# Patient Record
Sex: Male | Born: 1965 | Race: White | Hispanic: No | Marital: Married | Smoking: Current every day smoker
Health system: Southern US, Community
[De-identification: ages and names within clinical notes are randomized; demographics above are authoritative.]

---

## 2003-03-07 ENCOUNTER — Emergency Department (HOSPITAL_COMMUNITY): Admission: EM | Admit: 2003-03-07 | Discharge: 2003-03-07 | Payer: Self-pay | Admitting: Emergency Medicine

## 2015-09-08 ENCOUNTER — Encounter: Payer: Self-pay | Admitting: Vascular Surgery

## 2015-09-09 ENCOUNTER — Encounter: Payer: Self-pay | Admitting: Vascular Surgery

## 2015-09-16 ENCOUNTER — Encounter: Payer: Self-pay | Admitting: Vascular Surgery

## 2015-09-17 ENCOUNTER — Ambulatory Visit (INDEPENDENT_AMBULATORY_CARE_PROVIDER_SITE_OTHER): Payer: BLUE CROSS/BLUE SHIELD | Admitting: Vascular Surgery

## 2015-09-17 ENCOUNTER — Encounter: Payer: Self-pay | Admitting: Vascular Surgery

## 2015-09-17 VITALS — BP 130/88 | HR 82 | Temp 98.0°F | Resp 14 | Ht 67.0 in | Wt 135.0 lb

## 2015-09-17 DIAGNOSIS — K551 Chronic vascular disorders of intestine: Secondary | ICD-10-CM | POA: Diagnosis not present

## 2015-09-17 NOTE — Progress Notes (Signed)
[facility]ist [facility] [place]  Patient name: Alexander Weiss MRN: [id number] DOB: Oct 27, 1965 Sex: male  REASON FOR CONSULT: inferior mesenteric artery occlusion. Referred by Dr. Susann Givens  HPI: Alexander Weiss is a 50 y.o. male, who developed the sudden onset of lower abdominal pain and back pain in late May. He was seen in an emergency department and a CT scan was obtained to rule out a kidney stone. He was noted to have some plaque in the infrarenal aorta and the origin of the inferior mesenteric artery appeared to be occluded. There was no associated colonic wall thickening to suggest bowel ischemia. He did not have a kidney stone.  His symptoms lasted for about 10 days and then have completely resolved. Currently he denies any abdominal pain or back pain. He denies any history of postprandial abdominal pain or weight loss.  His risk factors for peripheral vascular disease include a family history of premature cardiovascular disease and a history of tobacco use. He denies any history of diabetes, hypertension, or hypercholesterolemia.  No past medical history on file. He denies any history of diabetes, hypertension, hypercholesterolemia, history of previous myocardial infarction, history of congestive heart failure, or history of arrhythmias.  No family history on file. his father died from a heart attack at age 50.  SOCIAL HISTORY: He has smoked 1 pack per day of cigarettes for over 20 years. Social History   Social History  . Marital Status: Married    Spouse Name: N/A  . Number of Children: N/A  . Years of Education: N/A   Occupational History  . Not on file.   Social History Main Topics  . Smoking status: Current Every Day Smoker -- 1.00 packs/day    Types: Cigarettes  . Smokeless tobacco: Not on file  . Alcohol Use: Not on file  . Drug Use: Not on file  . Sexual Activity: Not on file   Other Topics Concern  . Not on file   Social History Narrative     No Known Allergies  Current Outpatient Prescriptions  Medication Sig Dispense Refill  . aspirin-acetaminophen-caffeine (EXCEDRIN MIGRAINE) 250-250-65 MG tablet Take by mouth every 6 (six) hours as needed for headache.     No current facility-administered medications for this visit.    REVIEW OF SYSTEMS:   denotes positive finding,  denotes negative finding Cardiac  Comments:  Chest pain or chest pressure:    Shortness of breath upon exertion: X   Short of breath when lying flat:    Irregular heart rhythm:        Vascular    Pain in calf, thigh, or hip brought on by ambulation:    Pain in feet at night that wakes you up from your sleep:     Blood clot in your veins:    Leg swelling:         Pulmonary    Oxygen at home:    Productive cough:  X   Wheezing:         Neurologic    Sudden weakness in arms or legs:     Sudden numbness in arms or legs:     Sudden onset of difficulty speaking or slurred speech:    Temporary loss of vision in one eye:     Problems with dizziness:         Gastrointestinal    Blood in stool:     Vomited blood:         Genitourinary  Burning when urinating:     Blood in urine:        Psychiatric    Major depression:         Hematologic    Bleeding problems:    Problems with blood clotting too easily:        Skin    Rashes or ulcers:        Constitutional    Fever or chills:      PHYSICAL EXAM: Filed Vitals:   09/17/15 1607  BP: 130/88  Pulse: 82  Temp: 98 F (36.7 C)  TempSrc: Oral  Resp: 14  Height: 5\' 7"  (1.702 m)  Weight: 135 lb (61.236 kg)  SpO2: 99%    GENERAL: The patient is a well-nourished male, in no acute distress. The vital signs are documented above. CARDIAC: There is a regular rate and rhythm.  VASCULAR: I do not detect carotid bruits. He has palpable femoral, dorsalis pedis, and posterior tibial pulses bilaterally. He has no significant lower extremity swelling. PULMONARY: There is good air  exchange bilaterally without wheezing or rales. ABDOMEN: Soft and non-tender with normal pitched bowel sounds. I cannot palpate an abdominal aortic aneurysm. I do not appreciate an abdominal bruit. MUSCULOSKELETAL: There are no major deformities or cyanosis. NEUROLOGIC: No focal weakness or paresthesias are detected. SKIN: There are no ulcers or rashes noted. PSYCHIATRIC: The patient has a normal affect.  DATA:   CT SCAN ABDOMEN AND PELVIS: I have reviewed the CT scan of the abdomen and pelvis that was performed on 08/29/2015. There is moderate atherosclerotic plaque in the infrarenal aorta. The origin of the inferior mesenteric artery is noted to be occluded. The celiac and superior mesenteric arteries are patent.  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 I think this may very well have explained his symptoms. Fortunately, his superior mesenteric artery and celiac axis are widely patent and he has no evidence of mesenteric ischemia. He does not have any aneurysmal disease. Having laminated thrombus in the aorta which is my interpretation of the finding is very unusual. I have encouraged him to begin taking an aspirin a day (325 mg). We have also had a long discussion about the importance of tobacco cessation. I have recommended a follow up CT scan in 1 year. If the clot has resolved or is stable then I do not think he will need routine follow up studies. However if this has changed at all and he may need continued follow up. I'll be happy to see him sooner if any new vascular issues arise.  Waverly Ferrariickson, Christopher [facility] [place] [phone]

## 2015-09-22 ENCOUNTER — Telehealth: Payer: Self-pay | Admitting: Cardiovascular Disease

## 2015-09-22 NOTE — Telephone Encounter (Signed)
[facility] for appointment on 09/24/15 with Dr Allyson SabalBerry.  [facility] Dr Hazle CocaBerry's schedule on 09/24/15. lp

## 2015-09-24 ENCOUNTER — Encounter: Payer: Self-pay | Admitting: Cardiovascular Disease

## 2015-09-24 ENCOUNTER — Ambulatory Visit
Admission: RE | Admit: 2015-09-24 | Discharge: 2015-09-24 | Disposition: A | Discharge status: Home / Self Care | Payer: BLUE CROSS/BLUE SHIELD | Source: Ambulatory Visit | Attending: Cardiovascular Disease | Admitting: Cardiovascular Disease

## 2015-09-24 ENCOUNTER — Encounter (INDEPENDENT_AMBULATORY_CARE_PROVIDER_SITE_OTHER): Payer: BLUE CROSS/BLUE SHIELD

## 2015-09-24 ENCOUNTER — Encounter: Payer: Self-pay | Admitting: Diagnostic Radiology

## 2015-09-24 ENCOUNTER — Other Ambulatory Visit: Payer: Self-pay | Admitting: *Deleted

## 2015-09-24 ENCOUNTER — Ambulatory Visit (INDEPENDENT_AMBULATORY_CARE_PROVIDER_SITE_OTHER): Payer: BLUE CROSS/BLUE SHIELD | Admitting: Cardiovascular Disease

## 2015-09-24 VITALS — BP 136/80 | HR 58 | Ht 67.0 in | Wt 137.0 lb

## 2015-09-24 DIAGNOSIS — K55059 Acute (reversible) ischemia of intestine, part and extent unspecified: Secondary | ICD-10-CM

## 2015-09-24 DIAGNOSIS — I8290 Acute embolism and thrombosis of unspecified vein: Secondary | ICD-10-CM

## 2015-09-24 DIAGNOSIS — Z01818 Encounter for other preprocedural examination: Secondary | ICD-10-CM

## 2015-09-24 DIAGNOSIS — Z79899 Other long term (current) drug therapy: Secondary | ICD-10-CM

## 2015-09-24 DIAGNOSIS — K55069 Acute infarction of intestine, part and extent unspecified: Secondary | ICD-10-CM

## 2015-09-24 NOTE — Progress Notes (Signed)
09/24/2015 Alexander Weiss   May 26, 1965  [id number]  Primary Physician Hadley PenOBBINS,ROBERT A, MD Primary Cardiologist: Runell GessJonathan J Berry MD Roseanne RenoFACP, FACC, FAHA, FSCAI  HPI:  Alexander Weiss is a very pleasant 50 year old married Caucasian male father of 2 who works for [employer]. He is referred by Dr. Sherral Hammersobbins for peripheral vascular evaluation because of presumed acute mesenteric ischemia. His risk factors include 30-pack-years of tobacco abuse smoking one pack per day. His father died of a myocardial infarction at age 48. history of the acute onset of lower abdominal and back pain 3 weeks ago some nausea and vomiting. His last for approximately a week. He went to urgent care Center where an abdominal CT angiographic was ordered revealing an occluded inferior mesenteric artery with some laminated thrombus and this distal abdominal aorta. He saw Dr. Edilia Boickson and vein and vascular specialists on 09/17/15 who agreed that his symptoms were consistent with acute mesenteric ischemia.   Current Outpatient Prescriptions  Medication Sig Dispense Refill  . aspirin-acetaminophen-caffeine (EXCEDRIN MIGRAINE) 250-250-65 MG tablet Take by mouth every 6 (six) hours as needed for headache.    . nebivolol (BYSTOLIC) 5 MG tablet Take 5 mg by mouth daily.     No current facility-administered medications for this visit.    No Known Allergies  Social History   Social History  . Marital Status: Married    Spouse Name: N/A  . Number of Children: N/A  . Years of Education: N/A   Occupational History  . Not on file.   Social History Main Topics  . Smoking status: Current Every Day Smoker -- 1.00 packs/day    Types: Cigarettes  . Smokeless tobacco: Former NeurosurgeonUser  . Alcohol Use: Not on file  . Drug Use: Not on file  . Sexual Activity: Not on file   Other Topics Concern  . Not on file   Social History Narrative     Review of Systems: General: negative for chills, fever, night sweats or weight changes.    Cardiovascular: negative for chest pain, dyspnea on exertion, edema, orthopnea, palpitations, paroxysmal nocturnal dyspnea or shortness of breath Dermatological: negative for rash Respiratory: negative for cough or wheezing Urologic: negative for hematuria Abdominal: negative for nausea, vomiting, diarrhea, bright red blood per rectum, melena, or hematemesis Neurologic: negative for visual changes, syncope, or dizziness All other systems reviewed and are otherwise negative except as noted above.    Blood pressure 136/80, pulse 58, height 5\' 7"  (1.702 m), weight 137 lb (62.143 kg).  General appearance: alert and no distress Neck: no adenopathy, no carotid bruit, no JVD, supple, symmetrical, trachea midline and thyroid not enlarged, symmetric, no tenderness/mass/nodules Lungs: clear to auscultation bilaterally Heart: regular rate and rhythm, S1, S2 normal, no murmur, click, rub or gallop Extremities: extremities normal, atraumatic, no cyanosis or edema  EKG sinus bradycardia at 58  with ST or T-wave changes. I personally reviewed this EKG  ASSESSMENT AND PLAN:   Acute mesenteric ischemia [facility](HCC) Alexander Weiss was referred by Dr. Sherral Hammersobbins for evaluation of acute onset back and abdominal pain approximately 3 weeks ago lasting for a week. He had a CT angiogram that showed obstruction of his and her mesenteric artery with some laminated thrombus in his distal abdominal aorta. There is no CT evidence of mesenteric ischemia however. He denies palpitations. He's never had a stroke . I am concerned that this may have been thromboembolic. I agree that starting aspirin would be appropriate. I'm going to get a 30 day event  monitor and arrange for him to have a TEE echo in hospital.      Runell GessJonathan J. Berry MD [facility] CenterFACP,FACC,FAHA, [facility]SCAI 09/24/2015 1:48 PM

## 2015-09-24 NOTE — Assessment & Plan Note (Signed)
Mr. Alexander Weiss was referred by Dr. Sherral Hammersobbins for evaluation of acute onset back and abdominal pain approximately 3 weeks ago lasting for a week. He had a CT angiogram that showed obstruction of his and her mesenteric artery with some laminated thrombus in his distal abdominal aorta. There is no CT evidence of mesenteric ischemia however. He denies palpitations. He's never had a stroke . I am concerned that this may have been thromboembolic. I agree that starting aspirin would be appropriate. I'm going to get a 30 day event monitor and arrange for him to have a TEE echo in hospital.

## 2015-09-24 NOTE — Patient Instructions (Addendum)
Medication Instructions:  Your physician recommends that you continue on your current medications as directed. Please refer to the Current Medication list given to you today.   LABWORK AND CHEST X RAY: 1-- Your physician recommends that you return for lab work WITHIN 14 DAYS OF THE PROCEDURE.  2. Chest Xray-the chest [facility].      Testing/Procedures: Your physician has recommended that you wear an event monitor. Event monitors are medical devices that record the heart's electrical activity. Doctors most often us these monitors to diagnose arrhythmias. Arrhythmias are problems with the speed or rhythm of the heartbeat. The monitor is a small, portable device. You can wear one while you do your normal daily activities. This is usually used to diagnose what is causing palpitations/syncope (passing out).  30 DAY MONITOR.   Your physician has requested that you have a TEE. During a TEE, sound waves are used to create images of your heart. It provides your doctor with information about the size and shape of your heart and how well your heart's chambers and valves are working. In this test, a transducer is attached to the end of a flexible tube that's guided down your throat and into your esophagus (the tube leading from you mouth to your stomach) to get a more detailed image of your heart. You are not awake for the procedure. Please see the instruction sheet given to you today. For further information please visit [email or web address].   Follow-Up: Your physician recommends that you schedule a follow-up appointment AFTER THE PROCEDURES ARE COMPLETE.   Any Other Special Instructions Will Be Listed Below (If Applicable).     If you need a refill on your cardiac medications before your next appointment, please call your pharmacy.

## 2015-09-28 ENCOUNTER — Encounter (HOSPITAL_COMMUNITY): Admission: RE | Payer: Self-pay | Source: Ambulatory Visit

## 2015-09-28 SURGERY — ECHOCARDIOGRAM, TRANSESOPHAGEAL
Anesthesia: Moderate Sedation

## 2015-09-29 ENCOUNTER — Encounter: Payer: Self-pay | Admitting: Cardiovascular Disease

## 2015-09-29 ENCOUNTER — Ambulatory Visit (HOSPITAL_COMMUNITY): Admission: RE | Admit: 2015-09-29 | Payer: BLUE CROSS/BLUE SHIELD | Source: Ambulatory Visit | Admitting: Cardiology

## 2015-11-18 ENCOUNTER — Encounter: Payer: Self-pay | Admitting: *Deleted

## 2015-12-10 ENCOUNTER — Ambulatory Visit: Payer: Self-pay | Admitting: Sports Medicine

## 2016-01-26 DIAGNOSIS — Z720 Tobacco use: Secondary | ICD-10-CM | POA: Insufficient documentation

## 2016-01-26 DIAGNOSIS — K4020 Bilateral inguinal hernia, without obstruction or gangrene, not specified as recurrent: Secondary | ICD-10-CM | POA: Insufficient documentation

## 2016-03-01 DIAGNOSIS — Z09 Encounter for follow-up examination after completed treatment for conditions other than malignant neoplasm: Secondary | ICD-10-CM | POA: Insufficient documentation

## 2016-09-15 ENCOUNTER — Encounter: Payer: Self-pay | Admitting: Vascular Surgery

## 2016-09-18 ENCOUNTER — Other Ambulatory Visit: Payer: BLUE CROSS/BLUE SHIELD

## 2016-09-27 ENCOUNTER — Ambulatory Visit: Payer: BLUE CROSS/BLUE SHIELD | Admitting: Vascular Surgery

## 2017-03-29 DIAGNOSIS — A040 Enteropathogenic Escherichia coli infection: Secondary | ICD-10-CM | POA: Insufficient documentation

## 2017-05-16 ENCOUNTER — Encounter: Payer: Self-pay | Admitting: Gastroenterology

## 2017-06-22 ENCOUNTER — Ambulatory Visit: Payer: BLUE CROSS/BLUE SHIELD | Admitting: Sports Medicine

## 2017-06-22 ENCOUNTER — Other Ambulatory Visit: Payer: Self-pay | Admitting: Sports Medicine

## 2017-06-22 ENCOUNTER — Telehealth: Payer: Self-pay | Admitting: *Deleted

## 2017-06-22 ENCOUNTER — Ambulatory Visit (INDEPENDENT_AMBULATORY_CARE_PROVIDER_SITE_OTHER): Payer: 59

## 2017-06-22 ENCOUNTER — Encounter: Payer: Self-pay | Admitting: Sports Medicine

## 2017-06-22 DIAGNOSIS — M25476 Effusion, unspecified foot: Secondary | ICD-10-CM

## 2017-06-22 DIAGNOSIS — I739 Peripheral vascular disease, unspecified: Secondary | ICD-10-CM

## 2017-06-22 DIAGNOSIS — M7742 Metatarsalgia, left foot: Secondary | ICD-10-CM | POA: Diagnosis not present

## 2017-06-22 DIAGNOSIS — M79671 Pain in right foot: Secondary | ICD-10-CM

## 2017-06-22 DIAGNOSIS — M79672 Pain in left foot: Secondary | ICD-10-CM

## 2017-06-22 DIAGNOSIS — M722 Plantar fascial fibromatosis: Secondary | ICD-10-CM

## 2017-06-22 DIAGNOSIS — M25473 Effusion, unspecified ankle: Secondary | ICD-10-CM

## 2017-06-22 DIAGNOSIS — M25474 Effusion, right foot: Secondary | ICD-10-CM

## 2017-06-22 DIAGNOSIS — L819 Disorder of pigmentation, unspecified: Secondary | ICD-10-CM

## 2017-06-22 DIAGNOSIS — R609 Edema, unspecified: Secondary | ICD-10-CM

## 2017-06-22 DIAGNOSIS — M25471 Effusion, right ankle: Secondary | ICD-10-CM

## 2017-06-22 MED ORDER — DICLOFENAC SODIUM 75 MG PO TBEC: 75.0000 mg | DELAYED_RELEASE_TABLET | Freq: Two times a day (BID) | ORAL | 0 refills | Status: DC

## 2017-06-22 NOTE — Telephone Encounter (Signed)
-----   Message from [place] Stover, [place] sent at 06/22/2017 11:55 AM EDT ----- Regarding: Venous reflux testing  PVD Eval for reflux since has swelling with pigment changes to legs and ankles bilateral

## 2017-06-22 NOTE — Progress Notes (Signed)
Subjective: Alexander Weiss is a 52 y.o. male patient who presents to office for evaluation of left greater than right foot pain and swelling. Patient complains of progressive pain especially over the last 3 weeks that initially started with pain in the knee however now has pain in the ball of the left foot and also at both ankles sometimes the pain can be very sharp 8 out of 10 states that he went to the emergency department at [facility] and was given a steroid and has now completed and still has pain in symptoms.  Patient denies any trauma or injury does state that by the end of his workday his feet are very painful and the swelling is worse.  Admits that his legs feel heavy and that sometimes he feels like the fluid in his legs are much higher at the level of his thighs and sometimes spirits is with increased exertion shortness of breath however no symptoms at rest. Patient denies any other pedal complaints.  Patient Active Problem List   Diagnosis Date Noted  . Acute mesenteric ischemia (HCC) 09/24/2015    Current Outpatient Medications on File Prior to Visit  Medication Sig Dispense Refill  . aspirin-acetaminophen-caffeine (EXCEDRIN MIGRAINE) 250-250-65 MG tablet Take by mouth every 6 (six) hours as needed for headache.    . nebivolol (BYSTOLIC) 5 MG tablet Take 5 mg by mouth daily.     No current facility-administered medications on file prior to visit.     No Known Allergies  Objective:  General: Alert and oriented x3 in no acute distress  Dermatology: No open lesions bilateral lower extremities, no webspace macerations, no ecchymosis bilateral, all nails x 10 are well manicured.  Vascular: Dorsalis Pedis and Posterior Tibial pedal pulses palpable 1 out of 4 after edema has been displaced in areas at his ankles the edema is 1+ pitting, Capillary Fill Time 3 seconds, diminished pedal hair growth bilateral, temperature gradient within normal limits.  There are significant brawny  hyperpigmentation noted to both lower extremities with mild venous stasis changes.  Neurology: Michaell Cowing sensation intact via light touch bilateral.  Musculoskeletal: Mild tenderness with palpation at ball of left foot diffusely and along the plantar fascia bilateral left greater than right. Strength within normal limits in all groups bilateral.   Gait: Antalgic gait  Xrays  Left and right foot/ankle   Impression: Normal osseous mineralization, joint spaces preserved except very minor narrowing at the anterior ankle, there is mild posterior heel spur at the insertion of the Achilles, Achilles intact, mild diffuse soft tissue swelling, no other acute symptoms.  Assessment and Plan: Problem List Items Addressed This Visit    None    Visit Diagnoses    Foot pain, bilateral    -  Primary   Relevant Medications   diclofenac (VOLTAREN) 75 MG EC tablet   Other Relevant Orders   DG Ankle Complete Right   DG Ankle Complete Left   DG Foot 2 Views Left   PVD (peripheral vascular disease) (HCC)       Bilateral swelling of feet and ankles       Plantar fasciitis       Metatarsalgia of left foot         -Complete examination performed -Xrays reviewed -Discussed treatement options for foot pain and swelling -Rx venous reflux testing for further evaluation since previous venous Doppler was negative and advised patient meanwhile to get compression garments to the level of the knee from elastic therapy -Advised patient if  his left knee continues to bother him that he should get a referral to orthopedics from his primary doctor or further discuss with his primary doctor for possibility of ordering a MRI scan to evaluate need closer since [facility] for any acute findings at the knee -Applied heel and metatarsal pad to shoes and advised patient to continue with the same if these pads worked well patient may benefit long-term from orthotics -Prescribed diclofenac to  take as instructed for the pain and inflammation -Advised gentle stretching and icing for pain along bottom of feet and plantar fascia and ball -Patient to return to office after vascular test or sooner if condition worsens.  Asencion Islamitorya Safa Derner, DPM

## 2017-06-22 NOTE — Addendum Note (Signed)
Addended by: Asencion IslamSTOVER, Judie Hollick T on: 06/22/2017 08:51 PM   Modules accepted: Orders

## 2017-06-22 NOTE — Telephone Encounter (Signed)
Left message [facility] - [facility] to call with earliest appt.

## 2017-06-22 NOTE — Telephone Encounter (Signed)
Left message requesting Wednesday appt for venous reflux for edema, and change pigment in legs.

## 2017-07-05 ENCOUNTER — Encounter (HOSPITAL_COMMUNITY): Payer: BLUE CROSS/BLUE SHIELD

## 2017-07-11 ENCOUNTER — Ambulatory Visit (HOSPITAL_COMMUNITY)
Admission: RE | Admit: 2017-07-11 | Discharge: 2017-07-11 | Disposition: A | Discharge status: Home / Self Care | Payer: 59 | Source: Ambulatory Visit | Attending: Cardiovascular Disease | Admitting: Cardiovascular Disease

## 2017-07-11 DIAGNOSIS — R0602 Shortness of breath: Secondary | ICD-10-CM | POA: Diagnosis not present

## 2017-07-11 DIAGNOSIS — R609 Edema, unspecified: Secondary | ICD-10-CM | POA: Diagnosis not present

## 2017-07-11 DIAGNOSIS — L819 Disorder of pigmentation, unspecified: Secondary | ICD-10-CM | POA: Insufficient documentation

## 2017-07-11 DIAGNOSIS — M7989 Other specified soft tissue disorders: Secondary | ICD-10-CM | POA: Diagnosis present

## 2017-07-19 IMAGING — CR DG CHEST 2V
2 series · 2 of 2 positions shown · non-contrast
Comparison: None in PACs

CLINICAL DATA: Preoperative examination, acute mesenteric ischemia
with thrombus, shortness of breath

EXAM:
CHEST  2 VIEW

[w chest pa]
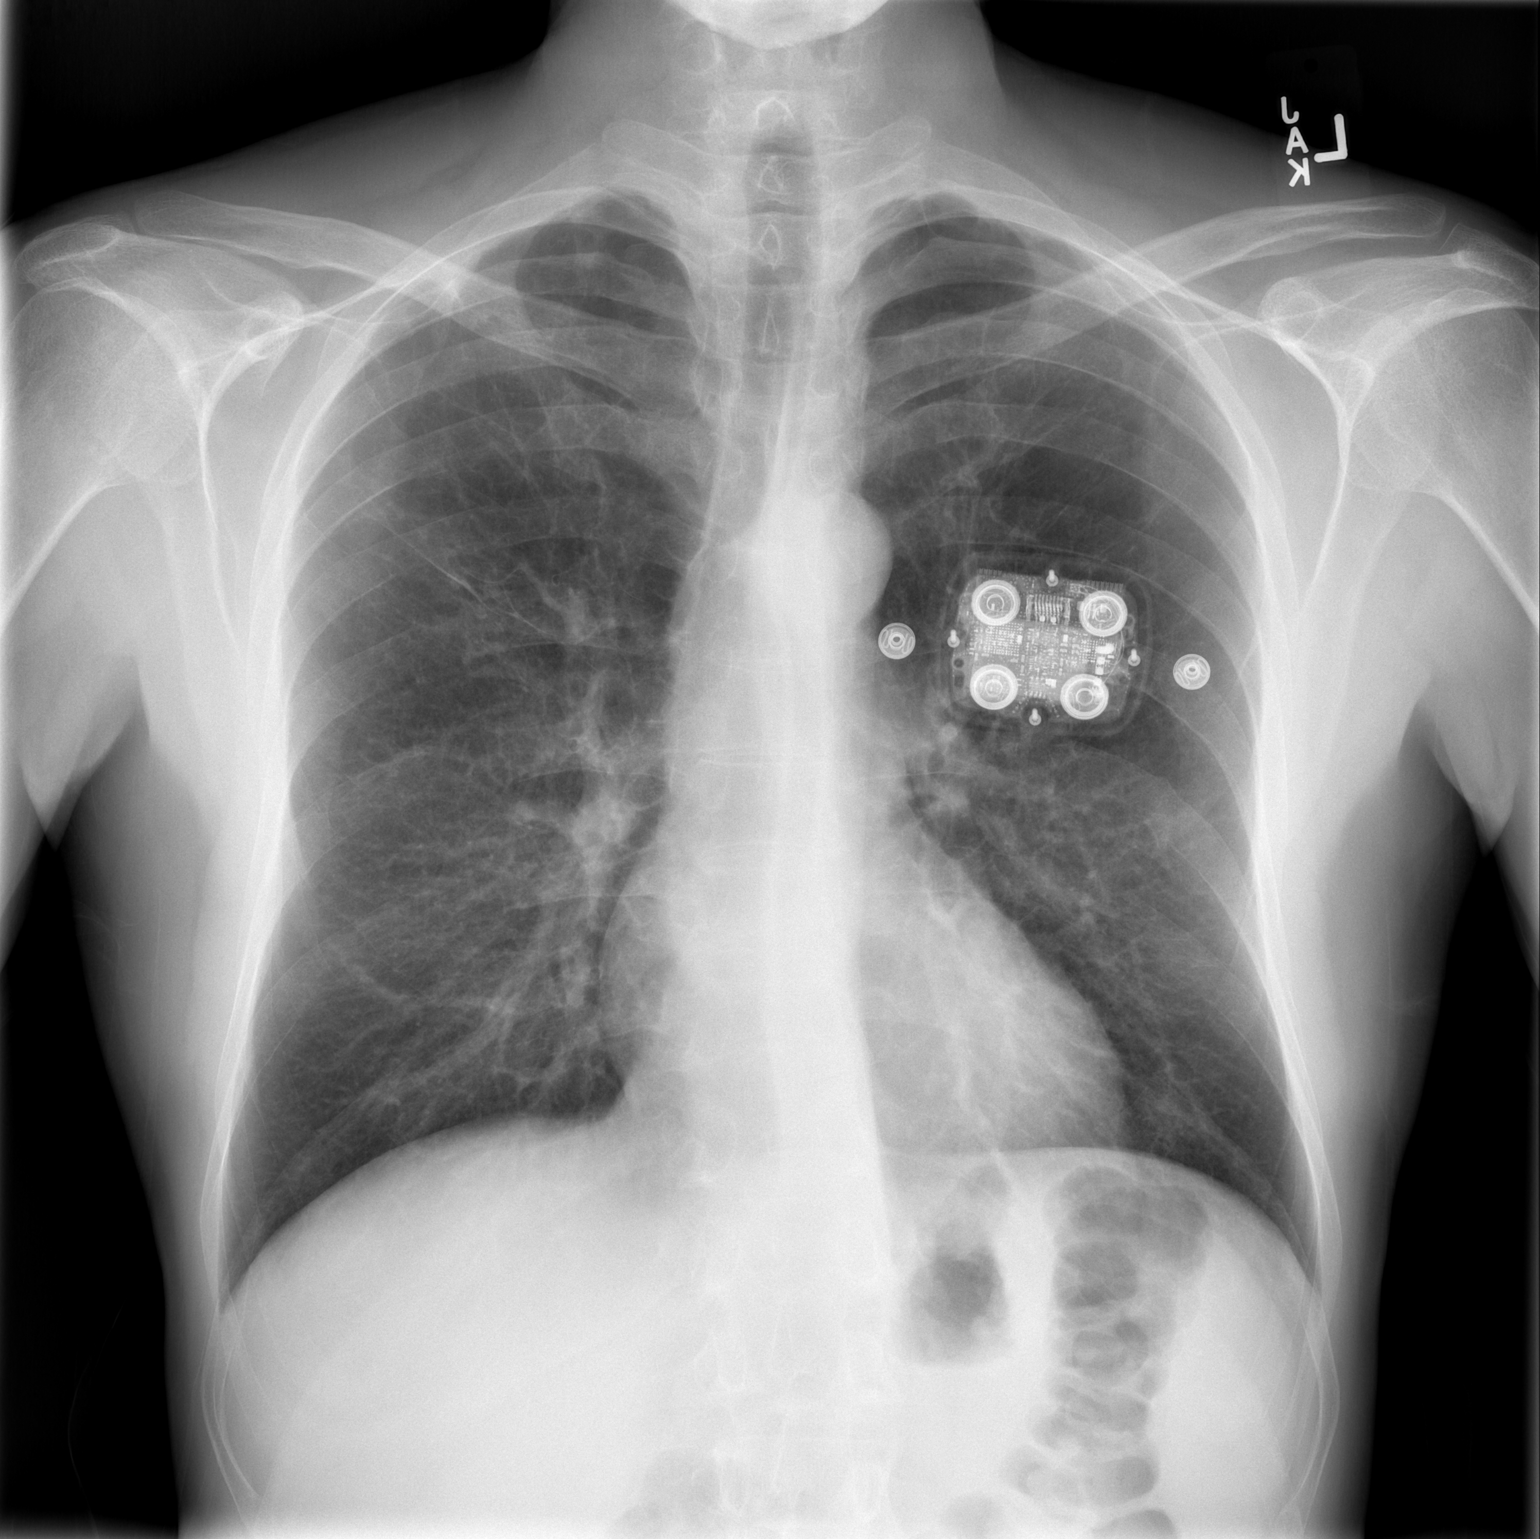

[w chest lat]
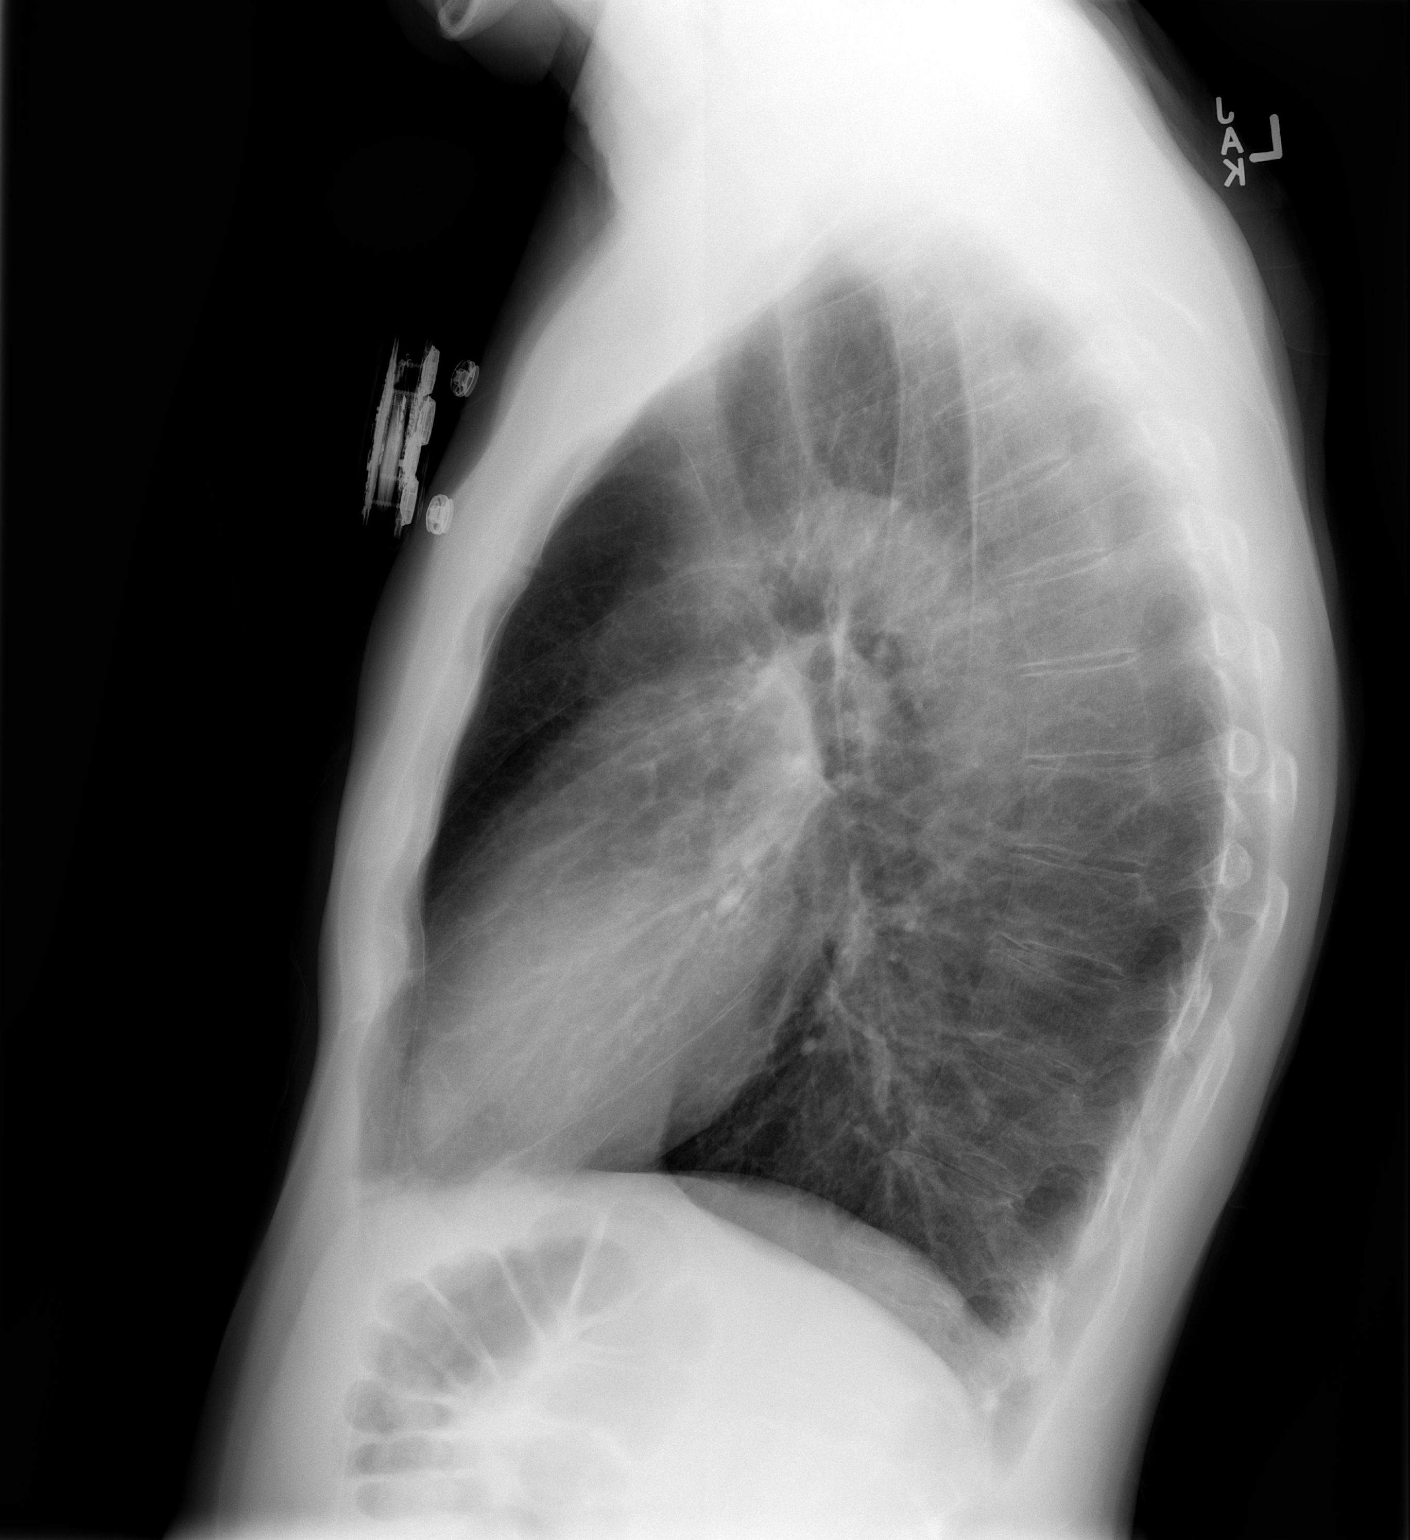

[2 of 2 positions shown; findings below may reference images not displayed]

FINDINGS: The lungs are well-expanded. The interstitial markings are coarse.
There is subtle bullous changes in the apices. There is no pleural
effusion or alveolar infiltrate. The heart and pulmonary vascularity
are normal. The mediastinum is normal in width. An external cardiac
monitor control unit is present over the left pectoral region. The
bony thorax is unremarkable.
IMPRESSION: COPD with upper lobe bullous changes. There is no pneumonia, CHF,
nor other acute cardiopulmonary disease.

## 2017-08-01 ENCOUNTER — Ambulatory Visit (INDEPENDENT_AMBULATORY_CARE_PROVIDER_SITE_OTHER): Payer: 59 | Admitting: Sports Medicine

## 2017-08-01 ENCOUNTER — Encounter: Payer: Self-pay | Admitting: Sports Medicine

## 2017-08-01 ENCOUNTER — Encounter: Payer: Self-pay | Admitting: *Deleted

## 2017-08-01 ENCOUNTER — Telehealth: Payer: Self-pay | Admitting: *Deleted

## 2017-08-01 ENCOUNTER — Ambulatory Visit (INDEPENDENT_AMBULATORY_CARE_PROVIDER_SITE_OTHER): Payer: 59

## 2017-08-01 ENCOUNTER — Other Ambulatory Visit: Payer: Self-pay | Admitting: Sports Medicine

## 2017-08-01 DIAGNOSIS — S86011A Strain of right Achilles tendon, initial encounter: Secondary | ICD-10-CM

## 2017-08-01 DIAGNOSIS — M79671 Pain in right foot: Secondary | ICD-10-CM

## 2017-08-01 DIAGNOSIS — M779 Enthesopathy, unspecified: Secondary | ICD-10-CM | POA: Diagnosis not present

## 2017-08-01 MED ORDER — TRAMADOL HCL 50 MG PO TABS: 50.0000 mg | ORAL_TABLET | Freq: Four times a day (QID) | ORAL | 0 refills | Status: DC | PRN

## 2017-08-01 NOTE — Telephone Encounter (Signed)
-----   Message from Asencion Islam, [place] sent at 08/01/2017  1:36 PM EDT ----- Regarding: MRI R ankle R/o tear of achilles also eval peroneal tendons Heard a pop today at back of heel

## 2017-08-01 NOTE — Progress Notes (Signed)
Subjective: Alexander Weiss is a 52 y.o. male patient who presents to office for evaluation of right heel pain reports that he heard a pop earlier this morning and started to experience pain and swelling in the right back of his heel.  Patient immediately call the office and was working as an emergency appointment.  Admits pain is about 5 out of 10 denies any other symptoms like nausea vomiting fever chills or calf pain at this time.  Patient Active Problem List   Diagnosis Date Noted  . Acute mesenteric ischemia (HCC) 09/24/2015    Current Outpatient Medications on File Prior to Visit  Medication Sig Dispense Refill  . aspirin-acetaminophen-caffeine (EXCEDRIN MIGRAINE) 250-250-65 MG tablet Take by mouth every 6 (six) hours as needed for headache.    . diclofenac (VOLTAREN) 75 MG EC tablet Take 1 tablet (75 mg total) by mouth 2 (two) times daily. 30 tablet 0  . nebivolol (BYSTOLIC) 5 MG tablet Take 5 mg by mouth daily.     No current facility-administered medications on file prior to visit.     No Known Allergies  Objective:  General: Alert and oriented x3 in no acute distress  Dermatology: No open lesions bilateral lower extremities, no webspace macerations, there is no ecchymosis at right heel, all nails x 10 are well manicured.  Vascular: Dorsalis Pedis and Posterior Tibial pedal pulses palpable 1 out of 4 with edema is 1+ pitting, Capillary Fill Time 3 seconds, diminished pedal hair growth bilateral, temperature gradient within normal limits.  There are significant brawny hyperpigmentation noted to both lower extremities with mild venous stasis changes.  Neurology: Michaell Cowing sensation intact via light touch bilateral.  Musculoskeletal: Mild tenderness with palpation right heel lateral aspect, Achilles intact, no dell, + pain to lateral Achilles and peroneals on right.  No acute signs for DVT.  Strength within normal limits in all groups bilateral.    Xrays  Right foot/ankle  Impression: Normal osseous mineralization, joint spaces preserved except very minor narrowing at the anterior ankle, there is mild posterior heel spur at the insertion of the Achilles, Achilles intact, Kagers triangle present, mild diffuse soft tissue swelling, no other acute symptoms.  Assessment and Plan: Problem List Items Addressed This Visit    None    Visit Diagnoses    Achilles tendon tear, right, initial encounter    -  Primary   possible partial tear   Relevant Medications   traMADol (ULTRAM) 50 MG tablet   Tendonitis       Relevant Medications   traMADol (ULTRAM) 50 MG tablet   Foot pain, right       Relevant Medications   traMADol (ULTRAM) 50 MG tablet     -Complete examination performed -Xrays reviewed -Discussed treatement options for possible acute on chronic partial Achilles tendon tear vs tendonitis  -Applied soft cast to keep intact for 5 to 7 days if patient goes for MRI before 5 or 7 days may remove the soft cast and resume using compression sleeve -Rx CAM boot for protective weightbearing: Advised patient not to drive with the boot -Rx Tramadol: Advised patient to not to make important medical decisions or to drive after taking pain medication -Recommend patient to rest ice elevate and take pain medicine as prescribed -Recommend no work until next office visit: Work note given -Patient to return to office after MRI or sooner if condition worsens.  Asencion Islam, DPM

## 2017-08-02 NOTE — Telephone Encounter (Signed)
[employer] - [place] states this Case Ref# [id number] needs to go to [facility] fax clinicals to [phone]. Faxed to [employer].

## 2017-08-10 ENCOUNTER — Ambulatory Visit
Admission: RE | Admit: 2017-08-10 | Discharge: 2017-08-10 | Disposition: A | Discharge status: Home / Self Care | Payer: 59 | Source: Ambulatory Visit | Attending: Sports Medicine | Admitting: Sports Medicine

## 2017-08-15 ENCOUNTER — Ambulatory Visit (INDEPENDENT_AMBULATORY_CARE_PROVIDER_SITE_OTHER): Payer: 59 | Admitting: Sports Medicine

## 2017-08-15 ENCOUNTER — Encounter: Payer: Self-pay | Admitting: Sports Medicine

## 2017-08-15 ENCOUNTER — Encounter: Payer: Self-pay | Admitting: *Deleted

## 2017-08-15 DIAGNOSIS — S86311A Strain of muscle(s) and tendon(s) of peroneal muscle group at lower leg level, right leg, initial encounter: Secondary | ICD-10-CM | POA: Diagnosis not present

## 2017-08-15 DIAGNOSIS — R609 Edema, unspecified: Secondary | ICD-10-CM | POA: Diagnosis not present

## 2017-08-15 DIAGNOSIS — M79671 Pain in right foot: Secondary | ICD-10-CM | POA: Diagnosis not present

## 2017-08-15 DIAGNOSIS — M7750 Other enthesopathy of unspecified foot: Secondary | ICD-10-CM | POA: Diagnosis not present

## 2017-08-15 DIAGNOSIS — M779 Enthesopathy, unspecified: Secondary | ICD-10-CM

## 2017-08-15 NOTE — Progress Notes (Signed)
Subjective: Alexander Weiss is a 52 y.o. male patient who returns to office for review of MRI results and for follow up evaluation of right heel/ankle pain; reports that he is feeling better and that the unna boot helped a lot. Reports that he did not wear his CAM boot today because he drove to appointment. Patient reports that he hasn't rested much or elevated or iced and has not used compression sleeve since he removed the unna boot. Denies any other issues or any other pedal complaints at this time.   Patient Active Problem List   Diagnosis Date Noted  . Acute mesenteric ischemia (HCC) 09/24/2015    Current Outpatient Medications on File Prior to Visit  Medication Sig Dispense Refill  . aspirin-acetaminophen-caffeine (EXCEDRIN MIGRAINE) 250-250-65 MG tablet Take by mouth every 6 (six) hours as needed for headache.    . diclofenac (VOLTAREN) 75 MG EC tablet Take 1 tablet (75 mg total) by mouth 2 (two) times daily. 30 tablet 0  . nebivolol (BYSTOLIC) 5 MG tablet Take 5 mg by mouth daily.    . traMADol (ULTRAM) 50 MG tablet Take 1 tablet (50 mg total) by mouth every 6 (six) hours as needed. 20 tablet 0   No current facility-administered medications on file prior to visit.     No Known Allergies  Objective:  General: Alert and oriented x3 in no acute distress  Dermatology: No open lesions bilateral lower extremities, no webspace macerations, there is no ecchymosis at right heel/lateral ankle, all nails x 10 are well manicured.  Vascular: Dorsalis Pedis and Posterior Tibial pedal pulses palpable 1 out of 4 with minimal edema, Capillary Fill Time 3 seconds, diminished pedal hair growth bilateral, temperature gradient within normal limits.  There are significant brawny hyperpigmentation noted to both lower extremities with mild venous stasis changes.  Neurology: Gross sensation intact via light touch bilateral.  Musculoskeletal: Minimal tenderness with palpation right heel/ankle lateral  aspect along peroneal tendon course , No pain to achilles on right, Achilles intact, no dell.  No acute signs for DVT.  Strength within normal limits in all groups bilateral.   MRI IMPRESSION: 1. Intact plantar fascia with subcortical marrow edema at the calcaneal insertion of the medial band of the plantar fascia. 2. Mild tendinosis of the peroneus brevis with a possible short-segment longitudinal split tear.  Assessment and Plan: Problem List Items Addressed This Visit    None    Visit Diagnoses    Peroneal tendon tear, right, initial encounter    -  Primary   Tendonitis       Foot pain, right       Swelling         -Complete examination performed -MRI reviewed -Discussed treatement options for longitudinal short segment tear of the peroneal brevis tendon with improved pain -Patient elects at this time instead of surgery physical therapy with Tri-Lock bracing; prescription was written for [facility] for physical therapy and a Tri-Lock ankle brace was dispensed and advised patient to wear at all times when attempting to ambulate for strenuous activity or extensive walking and standing. -May discontinue cam boot at this time since he will be wearing ankle support and good supportive tennis shoe -Advised patient once he returns to work may take Tylenol or Motrin for any residual pain and recommend rest ice elevation to help with any acute flareup of symptoms.  Advised patient if symptoms worsen or if pain worsens that he may take tramadol however may not work while taking  this medication or make important decisions. -Recommend return to work on Monday normal duty no restrictions -Patient to return to office in 1 month for follow-up after starting physical therapy and brace check or sooner if condition worsens.  Asencion Islam, DPM

## 2017-09-19 ENCOUNTER — Ambulatory Visit: Payer: 59 | Admitting: Sports Medicine

## 2017-09-21 ENCOUNTER — Ambulatory Visit: Payer: 59 | Admitting: Sports Medicine

## 2018-04-02 ENCOUNTER — Other Ambulatory Visit: Payer: Self-pay | Admitting: *Deleted

## 2018-04-02 DIAGNOSIS — S86311D Strain of muscle(s) and tendon(s) of peroneal muscle group at lower leg level, right leg, subsequent encounter: Secondary | ICD-10-CM | POA: Diagnosis not present

## 2018-04-02 NOTE — Progress Notes (Signed)
Patient ID: Alexander Weiss, male   DOB: 01-Mar-1966, 52 y.o.   MRN: [id number]   Patient fitted for an ankle stabilizer.

## 2018-05-07 DIAGNOSIS — M25562 Pain in left knee: Secondary | ICD-10-CM | POA: Insufficient documentation

## 2018-05-22 ENCOUNTER — Ambulatory Visit (INDEPENDENT_AMBULATORY_CARE_PROVIDER_SITE_OTHER): Payer: 59 | Admitting: Sports Medicine

## 2018-05-22 ENCOUNTER — Encounter: Payer: Self-pay | Admitting: Sports Medicine

## 2018-05-22 ENCOUNTER — Ambulatory Visit (INDEPENDENT_AMBULATORY_CARE_PROVIDER_SITE_OTHER): Payer: 59

## 2018-05-22 ENCOUNTER — Encounter: Payer: Self-pay | Admitting: *Deleted

## 2018-05-22 DIAGNOSIS — M722 Plantar fascial fibromatosis: Secondary | ICD-10-CM | POA: Diagnosis not present

## 2018-05-22 DIAGNOSIS — M79672 Pain in left foot: Secondary | ICD-10-CM

## 2018-05-22 MED ORDER — TRIAMCINOLONE ACETONIDE 10 MG/ML IJ SUSP
10.0000 mg | Freq: Once | INTRAMUSCULAR | Status: AC
  Administered 2018-05-22: 10 mg

## 2018-05-22 MED ORDER — DICLOFENAC SODIUM 75 MG PO TBEC: 75.0000 mg | DELAYED_RELEASE_TABLET | Freq: Two times a day (BID) | ORAL | 0 refills | Status: AC

## 2018-05-22 MED ORDER — METHYLPREDNISOLONE 4 MG PO TBPK: ORAL_TABLET | ORAL | 0 refills | Status: DC

## 2018-05-22 NOTE — Progress Notes (Signed)
Subjective: Alexander Weiss is a 53 y.o. male patient presents to office with complaint of moderate heel pain on the left states that pain is 10 out of 10 he can barely put pressure down on his heel states that it is worse when he goes from sitting to stand to put pressure states that the pain seems to radiate up the back of his heel as well states that on the right he is still been using his Tri-Lock brace to help protect his tendon tear however due to the way that he is walking feels like he is making pain worse on both sides and also reports that he has noticed a few little spots on the bottom of his left foot that he wants me to check.  Patient denies any new injury or trauma.  No other pedal complaints noted at this time.  Patient Active Problem List   Diagnosis Date Noted  . Acute pain of left knee 05/07/2018  . Enteritis, enteropathogenic E. coli 03/29/2017  . Postoperative examination 03/01/2016  . Non-recurrent bilateral inguinal hernia without obstruction or gangrene 01/26/2016  . Tobacco abuse 01/26/2016  . Acute mesenteric ischemia (HCC) 09/24/2015    Current Outpatient Medications on File Prior to Visit  Medication Sig Dispense Refill  . aspirin-acetaminophen-caffeine (EXCEDRIN MIGRAINE) 250-250-65 MG tablet Take by mouth every 6 (six) hours as needed for headache.    . nebivolol (BYSTOLIC) 5 MG tablet Take 5 mg by mouth daily.    . traMADol (ULTRAM) 50 MG tablet Take 1 tablet (50 mg total) by mouth every 6 (six) hours as needed. 20 tablet 0   No current facility-administered medications on file prior to visit.     No Known Allergies  Objective: Physical Exam General: The patient is alert and oriented x3 in no acute distress.  Dermatology: Skin is warm, dry and supple bilateral lower extremities. Nails 1-10 are normal. There is no erythema, edema, no eccymosis, no open lesions present. Integument is otherwise unremarkable.  Vascular: Dorsalis Pedis pulse and Posterior  Tibial pulse are 2/4 bilateral. Capillary fill time is immediate to all digits.  Neurological: Grossly intact to light touch with an achilles reflex of +2/5 and a  negative Tinel's sign bilateral.  Musculoskeletal: Tenderness to palpation at the medial calcaneal tubercale and through the insertion of the plantar fascia on the left and at Achilles on the left foot. No pain with compression of calcaneus bilateral. No pain with tuning fork to calcaneus bilateral. No pain with calf compression bilateral. There is decreased Ankle joint range of motion bilateral with some soreness along the peroneal tendon likely from compensation and overusing and aggravating the previous torn tendon.  Strength 5/5 in all groups bilateral.   Gait: Unassisted, Antalgic avoid weight on left heel  Xray, Left foot:  Normal osseous mineralization. Joint spaces preserved. No fracture/dislocation/boney destruction. Calcaneal spur present with mild thickening of plantar fascia. No other soft tissue abnormalities or radiopaque foreign bodies.   Assessment and Plan: Problem List Items Addressed This Visit    None    Visit Diagnoses    Left foot pain    -  Primary   Relevant Orders   DG Foot Complete Left   Plantar fasciitis of left foot       Relevant Medications   triamcinolone acetonide (KENALOG) 10 MG/ML injection 10 mg (Completed)      -Complete examination performed.  -Xrays reviewed -Discussed with patient in detail the condition of plantar fasciitis with Achilles tendinitis and  possible overuse peroneal tendinitis on the right secondary to compensation with history of previous tear, how this occurs and general treatment options. Explained both conservative and surgical treatments.  -After oral consent and aseptic prep, injected a mixture containing 1 ml of 2%  plain lidocaine, 1 ml 0.5% plain marcaine, 0.5 ml of kenalog 10 and 0.5 ml of dexamethasone phosphate into left heel. Post-injection care discussed with  patient.  -Rx Diclofenac to start after Medrol dose pack is completed -Recommended good supportive shoes and advised use of OTC insert. Explained to patient that if these orthoses work well, we will continue with these. If these do not improve his/her condition and  pain, we will consider custom molded orthoses. - Explained in detail the use of the fascial brace for the left which was dispensed at today's visit. -Explained and dispensed to patient daily stretching exercises. -Recommend patient to ice affected area 1-2x daily. -Work excuse given for tomorrow may resume normal schedule on Monday -Patient to return to office if pain is not improved in a few weeks or sooner if problems or questions arise.  Asencion Islam, DPM

## 2018-05-27 ENCOUNTER — Other Ambulatory Visit: Payer: Self-pay | Admitting: Sports Medicine

## 2018-05-27 DIAGNOSIS — M79672 Pain in left foot: Secondary | ICD-10-CM

## 2018-05-27 DIAGNOSIS — M722 Plantar fascial fibromatosis: Secondary | ICD-10-CM

## 2018-05-28 ENCOUNTER — Other Ambulatory Visit: Payer: Self-pay | Admitting: Gastroenterology

## 2018-05-28 NOTE — Telephone Encounter (Signed)
Please address

## 2018-05-29 ENCOUNTER — Encounter: Payer: Self-pay | Admitting: Sports Medicine

## 2018-05-29 ENCOUNTER — Ambulatory Visit (INDEPENDENT_AMBULATORY_CARE_PROVIDER_SITE_OTHER): Payer: 59 | Admitting: Sports Medicine

## 2018-05-29 DIAGNOSIS — M79672 Pain in left foot: Secondary | ICD-10-CM | POA: Diagnosis not present

## 2018-05-29 DIAGNOSIS — M722 Plantar fascial fibromatosis: Secondary | ICD-10-CM

## 2018-05-29 DIAGNOSIS — M779 Enthesopathy, unspecified: Secondary | ICD-10-CM | POA: Diagnosis not present

## 2018-05-29 NOTE — Progress Notes (Signed)
Subjective: Alexander Weiss is a 53 y.o. male patient returns to office for follow-up evaluation of left heel pain.  Patient reports that he feels like the pain is pulling in the back of his left heel.  Patient states that the injection helped a little but is still tender pain now is 7 out of 10 with sharp pains in the morning states that yesterday it was worse and he had just finished his steroid on yesterday and today he just started his diclofenac.  Patient has been trying to use his padding and trying to use his brace however feels like the brace rubs or aggravates the painful area at the bottom of his heel.  Patient denies any constitutional symptoms at this time such as nausea vomiting fever or chills.  Patient denies any significant warmth redness or color change to skin at the left heel.  No other pedal complaints noted.  Patient Active Problem List   Diagnosis Date Noted  . Acute pain of left knee 05/07/2018  . Enteritis, enteropathogenic E. coli 03/29/2017  . Postoperative examination 03/01/2016  . Non-recurrent bilateral inguinal hernia without obstruction or gangrene 01/26/2016  . Tobacco abuse 01/26/2016  . Acute mesenteric ischemia (HCC) 09/24/2015    Current Outpatient Medications on File Prior to Visit  Medication Sig Dispense Refill  . aspirin-acetaminophen-caffeine (EXCEDRIN MIGRAINE) 250-250-65 MG tablet Take by mouth every 6 (six) hours as needed for headache.    . diclofenac (VOLTAREN) 75 MG EC tablet Take 1 tablet (75 mg total) by mouth 2 (two) times daily. Take after you have finished medrol dosepak 30 tablet 0  . nebivolol (BYSTOLIC) 5 MG tablet Take 5 mg by mouth daily.    . traMADol (ULTRAM) 50 MG tablet Take 1 tablet (50 mg total) by mouth every 6 (six) hours as needed. 20 tablet 0   No current facility-administered medications on file prior to visit.     No Known Allergies  Objective: Physical Exam General: The patient is alert and oriented x3 in no acute  distress.  Dermatology: Skin is warm, dry and supple bilateral lower extremities. Nails 1-10 are normal. There is no erythema, edema, no eccymosis, no open lesions present. Integument is otherwise unremarkable.  Vascular: Dorsalis Pedis pulse and Posterior Tibial pulse are 2/4 bilateral. Capillary fill time is immediate to all digits.  Neurological: Grossly intact to light touch with an achilles reflex of +2/5 and a  negative Tinel's sign bilateral.  Musculoskeletal: Tenderness to palpation at the medial calcaneal tubercale and through the insertion of the plantar fascia on the left and at arch and at Achilles on the left foot. No pain with compression of calcaneus bilateral. No pain with tuning fork to calcaneus bilateral. No pain with calf compression bilateral. There is decreased Ankle joint range of motion bilateral with some soreness along the peroneal tendon likely from compensation and overusing and aggravating the previous torn tendon.  Strength 5/5 in all groups bilateral.    Assessment and Plan: Problem List Items Addressed This Visit    None    Visit Diagnoses    Plantar fasciitis of left foot    -  Primary   Tendonitis       Left foot pain          -Complete examination performed.  -Re-Discussed with patient in detail the condition of plantar fasciitis with Achilles tendinitis  -Patient declined repeat steroid injection at this visit in his heel -Continue with diclofenac 2-week course -Advised patient to continue  now with use of heel lifts once the pain gets better we will discuss custom orthotics - Explained in detail the use of the night splint for the left which was dispensed at today's visit and advised patient that he can continue with fascial brace as tolerated however if it is making pain worse to discontinue and to use Achilles sleeve that was dispensed at today's visit -Continue with daily stretching and icing the affected area once or twice daily -Patient to return  to office in 2 weeks and advised patient if pain is not improved to prepare for injection or sooner if problems or questions arise.  Asencion Islam, DPM

## 2018-05-31 ENCOUNTER — Telehealth: Payer: Self-pay | Admitting: Gastroenterology

## 2018-05-31 ENCOUNTER — Other Ambulatory Visit: Payer: Self-pay | Admitting: Gastroenterology

## 2018-05-31 NOTE — Telephone Encounter (Signed)
Pt wife called advised that her husband needs a refill for Protonix last seen was in [place]. Would like to know if he needs to come for an office visit or can he just get a refill. Call wife # [phone]

## 2018-05-31 NOTE — Telephone Encounter (Signed)
I  Have called patients wife back, waiting for a returned phone call.

## 2018-06-03 NOTE — Telephone Encounter (Signed)
I have called patients wife again and left a message for her to return my call.

## 2018-06-07 NOTE — Telephone Encounter (Signed)
I have called patient and left message

## 2018-06-12 ENCOUNTER — Other Ambulatory Visit: Payer: Self-pay

## 2018-06-12 ENCOUNTER — Ambulatory Visit (INDEPENDENT_AMBULATORY_CARE_PROVIDER_SITE_OTHER): Payer: 59 | Admitting: Sports Medicine

## 2018-06-12 ENCOUNTER — Encounter: Payer: Self-pay | Admitting: Sports Medicine

## 2018-06-12 DIAGNOSIS — M722 Plantar fascial fibromatosis: Secondary | ICD-10-CM

## 2018-06-12 DIAGNOSIS — M779 Enthesopathy, unspecified: Secondary | ICD-10-CM

## 2018-06-12 DIAGNOSIS — M79672 Pain in left foot: Secondary | ICD-10-CM

## 2018-06-12 MED ORDER — TRIAMCINOLONE ACETONIDE 10 MG/ML IJ SUSP
10.0000 mg | Freq: Once | INTRAMUSCULAR | Status: AC
  Administered 2018-06-12: 10 mg

## 2018-06-12 NOTE — Progress Notes (Signed)
Subjective: Ladamian Cude is a 53 y.o. male patient returns to office for follow-up evaluation of left heel pain.  Patient reports that he feels like the pain is starting to get sore again on Friday took a misstep and felt a pull at the back of the heel/feels tight 8/10. Patient reports that he has completed the diclofenac and has been using night splint and fascial brace as instructed with still symptoms at the bottom of his heel and tightness along the back of his calf.  Patient denies any changes with medications or any other pedal complaints at this time.  Patient Active Problem List   Diagnosis Date Noted  . Acute pain of left knee 05/07/2018  . Enteritis, enteropathogenic E. coli 03/29/2017  . Postoperative examination 03/01/2016  . Non-recurrent bilateral inguinal hernia without obstruction or gangrene 01/26/2016  . Tobacco abuse 01/26/2016  . Acute mesenteric ischemia (HCC) 09/24/2015    Current Outpatient Medications on File Prior to Visit  Medication Sig Dispense Refill  . aspirin-acetaminophen-caffeine (EXCEDRIN MIGRAINE) 250-250-65 MG tablet Take by mouth every 6 (six) hours as needed for headache.    . diclofenac (VOLTAREN) 75 MG EC tablet Take 1 tablet (75 mg total) by mouth 2 (two) times daily. Take after you have finished medrol dosepak 30 tablet 0  . nebivolol (BYSTOLIC) 5 MG tablet Take 5 mg by mouth daily.    . pantoprazole (PROTONIX) 40 MG tablet TAKE 1 TABLET BY MOUTH 1/2 HOUR BEFORE BREAKFAST. 30 tablet 0  . traMADol (ULTRAM) 50 MG tablet Take 1 tablet (50 mg total) by mouth every 6 (six) hours as needed. 20 tablet 0   No current facility-administered medications on file prior to visit.     No Known Allergies  Objective: Physical Exam General: The patient is alert and oriented x3 in no acute distress.  Dermatology: Skin is warm, dry and supple bilateral lower extremities. Nails 1-10 are normal. There is no erythema, edema, no eccymosis, no open lesions present.  Integument is otherwise unremarkable.  Vascular: Dorsalis Pedis pulse and Posterior Tibial pulse are 2/4 bilateral. Capillary fill time is immediate to all digits.  Neurological: Grossly intact to light touch with an achilles reflex of +2/5 and a  negative Tinel's sign bilateral.  Musculoskeletal: Tenderness to palpation at the medial calcaneal tubercale and through the insertion of the plantar fascia on the left and at arch and at Achilles on the left foot. No pain with compression of calcaneus bilateral. No pain with tuning fork to calcaneus bilateral. No pain with calf compression bilateral. There is decreased Ankle joint range of motion bilateral with some soreness along the peroneal tendon likely from compensation and overusing and aggravating the previous torn tendon.  Strength 5/5 in all groups bilateral.    Assessment and Plan: Problem List Items Addressed This Visit    None    Visit Diagnoses    Plantar fasciitis of left foot    -  Primary   Relevant Medications   triamcinolone acetonide (KENALOG) 10 MG/ML injection 10 mg (Completed) (Start on 06/12/2018  1:30 PM)   Left foot pain       Tendonitis          -Complete examination performed.  -Re-Discussed with patient in detail the condition of plantar fasciitis with Achilles tendinitis  -After oral consent and aseptic prep, injected a mixture containing 1 ml of 2%  plain lidocaine, 1 ml 0.5% plain marcaine, 0.5 ml of kenalog 10 and 0.5 ml of dexamethasone phosphate  into left heel at the glabrous junction without complication. Post-injection care discussed with patient. -Applied plantar fascial strapping and advised patient if this works well can proceed with custom insoles meanwhile we will check orthotic benefits -Advised patient to continue now with use of heel lifts  -Continue with night splint and may also try fascial brace with taping however if it is too much continue with taping only and once remove resume fascial  brace -Continue with daily stretching and icing the affected area once or twice daily -Patient to return to office in 2 weeks or sooner if problems or questions arise.  Asencion Islam, DPM

## 2018-06-26 ENCOUNTER — Ambulatory Visit: Payer: 59 | Admitting: Sports Medicine

## 2018-07-17 ENCOUNTER — Telehealth: Payer: Self-pay | Admitting: Gastroenterology

## 2018-07-17 NOTE — Telephone Encounter (Signed)
Patient would like a refill for pantoprazole (PROTONIX)

## 2018-07-17 NOTE — Telephone Encounter (Signed)
Dr. Chales Abrahams,  Do you want to see this pt before further refills. Looks like you have not seen him since you were in [place]. Pts last refill was in April of this year. He had a 30 day supply at that time.

## 2018-07-18 MED ORDER — PANTOPRAZOLE SODIUM 40 MG PO TBEC: DELAYED_RELEASE_TABLET | ORAL | 6 refills | Status: AC

## 2018-07-18 NOTE — Telephone Encounter (Signed)
Lets call in Protonix 40 mg p.o. once a day, 30 with 6 refills Follow-up in 6 months once COVID is over (hopefully)

## 2018-07-18 NOTE — Telephone Encounter (Signed)
Refills sent to pharmacy. 

## 2019-04-02 ENCOUNTER — Other Ambulatory Visit: Payer: Self-pay | Admitting: Gastroenterology

## 2019-06-05 IMAGING — MR MR ANKLE*R* W/O CM
3 of 7 series · 9 of 40 positions shown · non-contrast
Comparison: None.

CLINICAL DATA: No known injury.  Lateral pain and popping.

EXAM:
MRI OF THE RIGHT ANKLE WITHOUT CONTRAST
TECHNIQUE: Multiplanar, multisequence MR imaging of the ankle was performed. No
intravenous contrast was administered.

[Series 3: PD fat-sat · axial · right · 4.0mm · 0.23mm/px · z∈[-71,+48]mm · 3 of 27 slices shown]
[im 1/27]
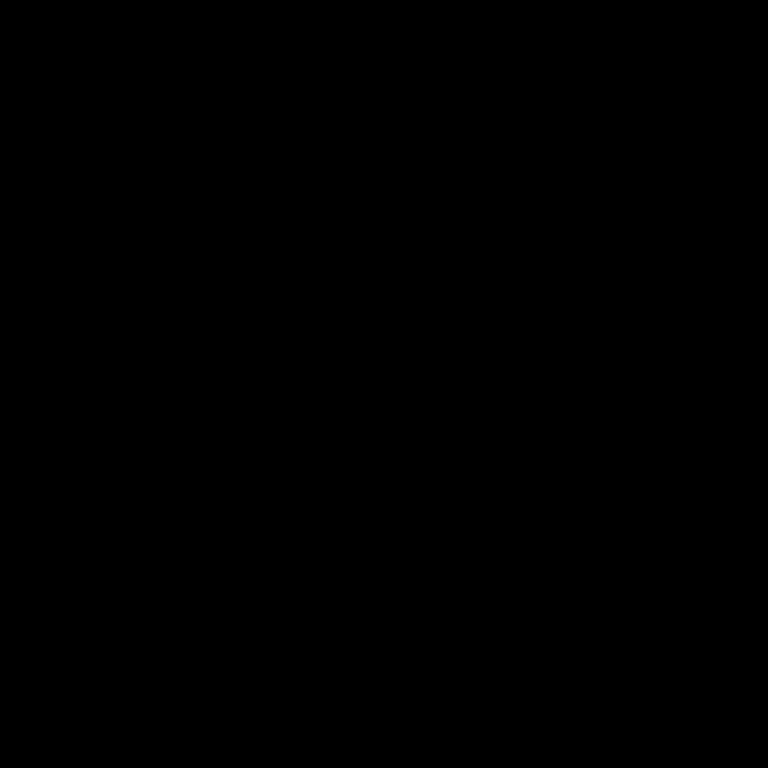
[im 14/27]
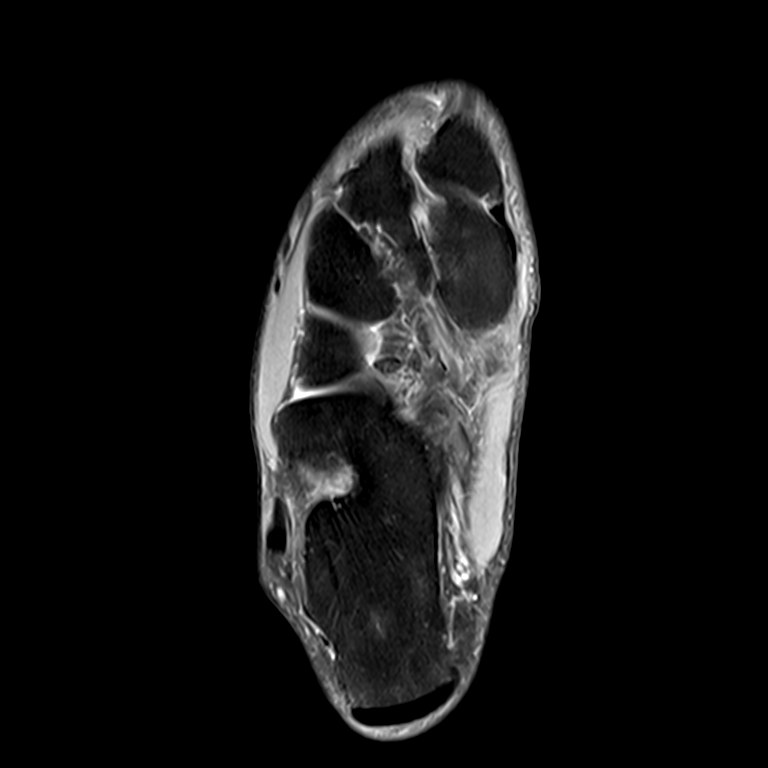
[im 27/27]
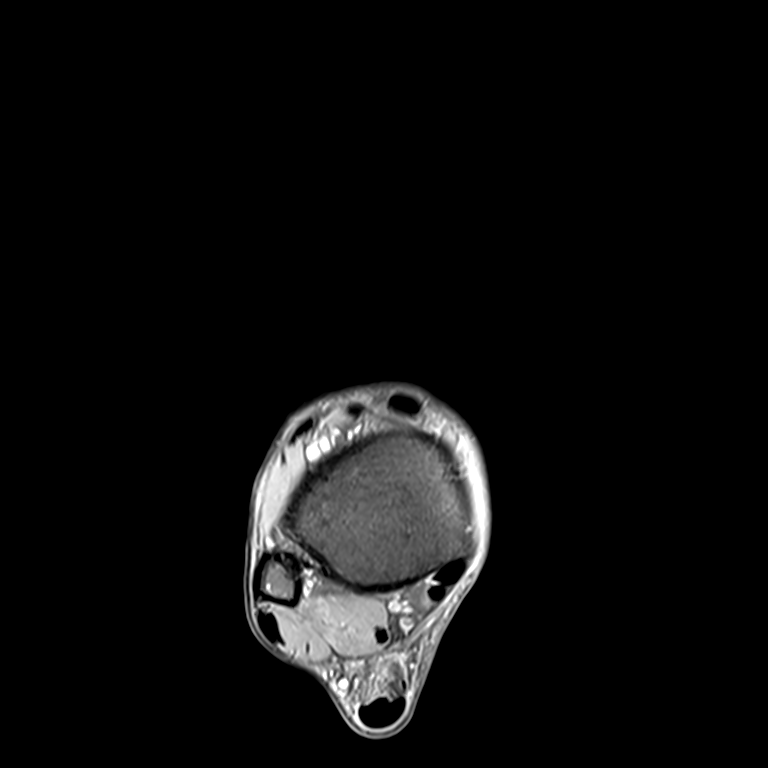

[Series 4: T2 fat-sat · axial · right · 4.0mm · 0.23mm/px · z∈[-71,+48]mm · 3 of 27 slices shown (1 of 2)]
[im 1/27]
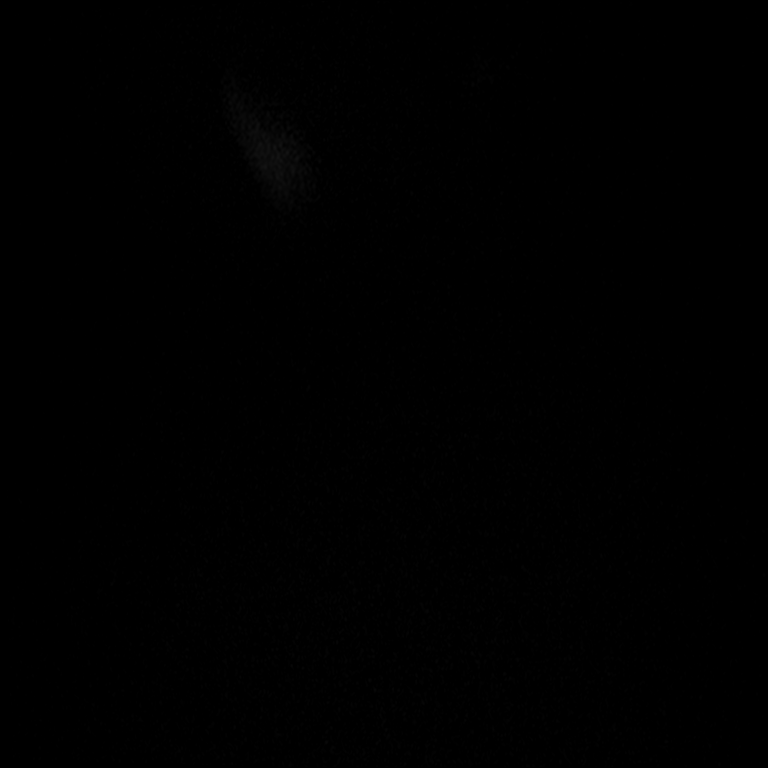
[im 14/27]
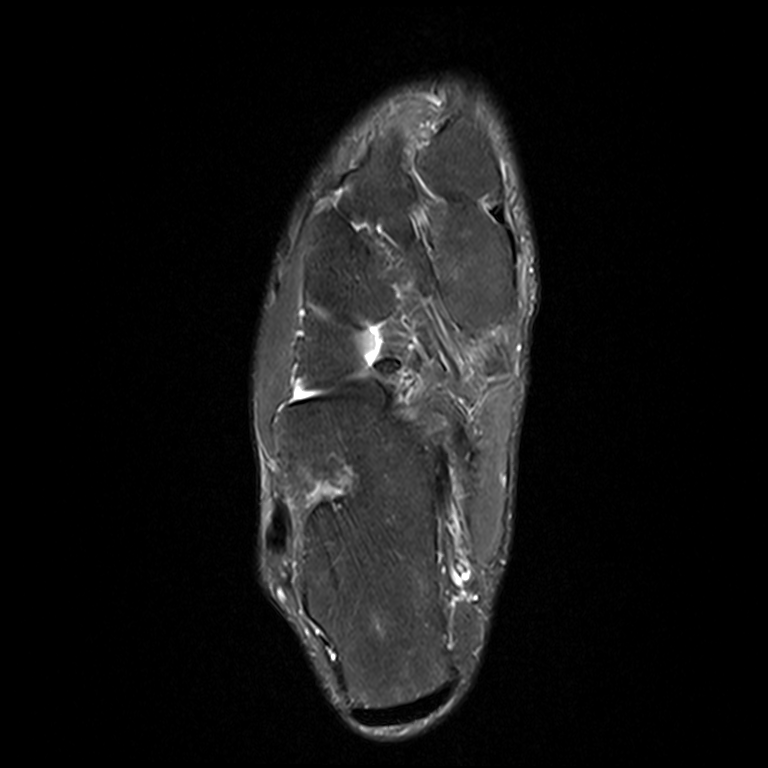
[im 27/27]
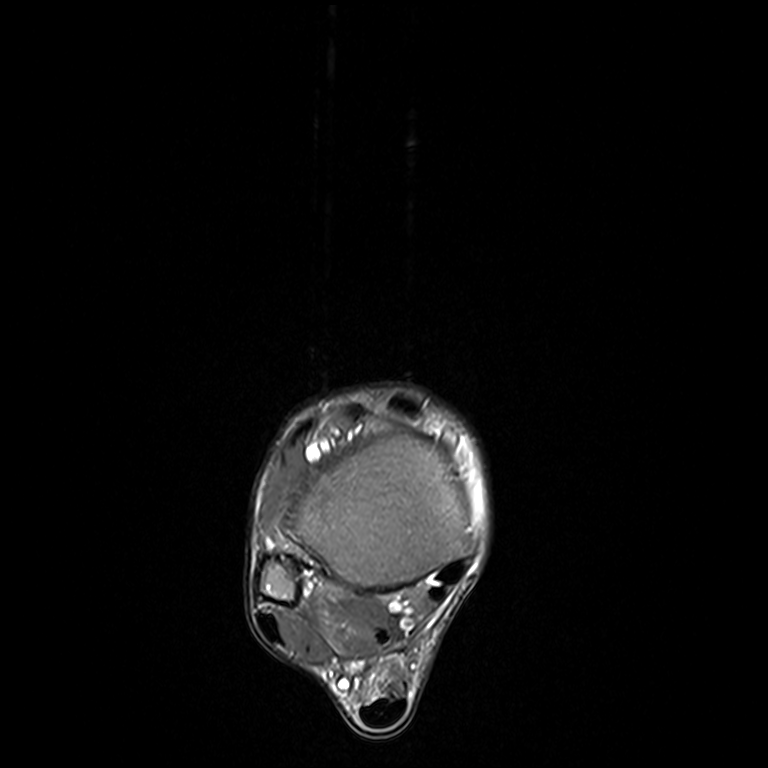

[Series 6: T2 fat-sat · sagittal · right · 2.5mm · 0.23mm/px · 3 of 39 slices shown (2 of 2)]
[im 8/39]
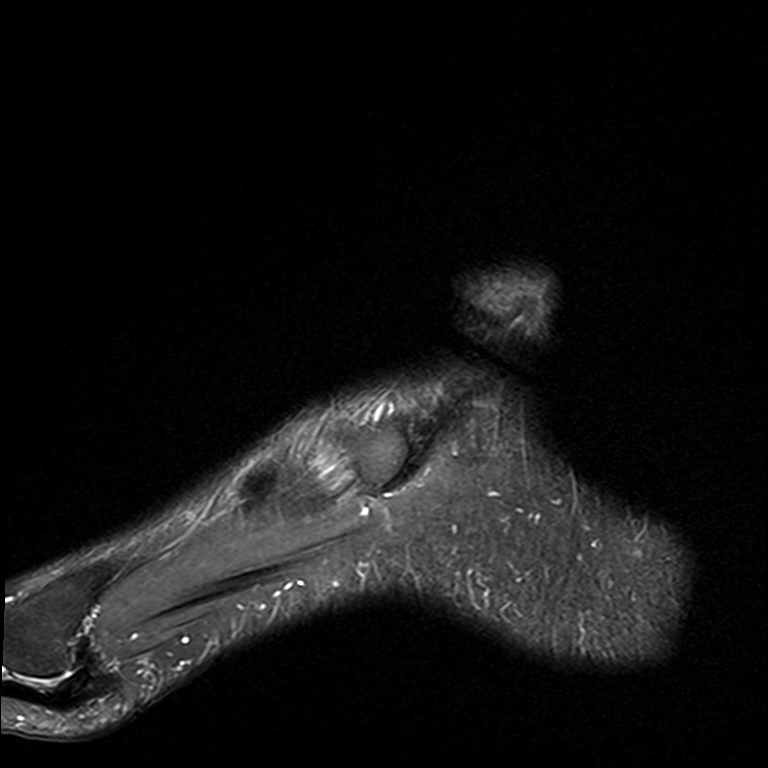
[im 23/39]
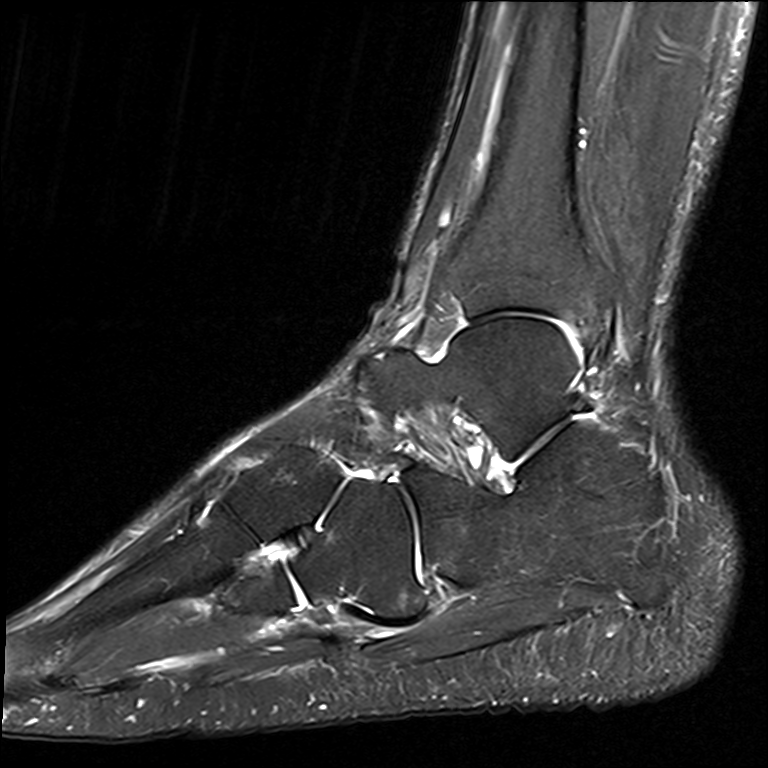
[im 39/39]
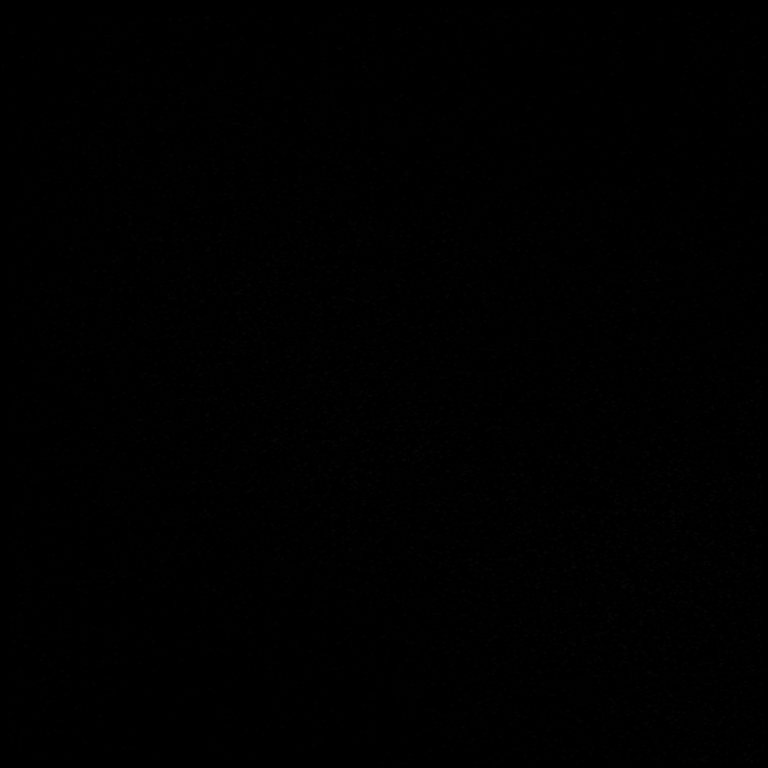

[9 of 40 positions shown; findings below may reference images not displayed]

FINDINGS: TENDONS

Peroneal: Peroneal longus tendon intact. Mild tendinosis of the
peroneus brevis with a possible short-segment longitudinal split
tear.

Posteromedial: Posterior tibial tendon intact. Flexor hallucis
longus tendon intact. Flexor digitorum longus tendon intact.

Anterior: Tibialis anterior tendon intact. Extensor hallucis longus
tendon intact Extensor digitorum longus tendon intact.

Achilles:  Intact.

Plantar Fascia: Intact plantar fascia with subcortical marrow edema
at the calcaneal insertion of the medial band of the plantar fascia.

LIGAMENTS

Lateral: Anterior talofibular ligament intact. Calcaneofibular
ligament intact. Posterior talofibular ligament intact. Anterior and
posterior tibiofibular ligaments intact.

Medial: Deltoid ligament intact. Spring ligament intact.

CARTILAGE

Ankle Joint: No joint effusion. Normal ankle mortise. No chondral
defect.

Subtalar Joints/Sinus Tarsi: Normal subtalar joints. No subtalar
joint effusion. Normal sinus tarsi.

Bones: Mild subchondral marrow edema along the distal lateral aspect
of medial cuneiform at the TMT joint. No fracture or dislocation.

Soft Tissue: No fluid collection or hematoma.  No soft tissue mass.
IMPRESSION: 1. Intact plantar fascia with subcortical marrow edema at the
calcaneal insertion of the medial band of the plantar fascia.
2. Mild tendinosis of the peroneus brevis with a possible
short-segment longitudinal split tear.

## 2019-08-07 ENCOUNTER — Other Ambulatory Visit: Payer: Self-pay

## 2019-08-07 ENCOUNTER — Ambulatory Visit (INDEPENDENT_AMBULATORY_CARE_PROVIDER_SITE_OTHER): Payer: BC Managed Care – PPO

## 2019-08-07 ENCOUNTER — Ambulatory Visit (INDEPENDENT_AMBULATORY_CARE_PROVIDER_SITE_OTHER): Payer: BC Managed Care – PPO | Admitting: Sports Medicine

## 2019-08-07 ENCOUNTER — Encounter: Payer: Self-pay | Admitting: Sports Medicine

## 2019-08-07 ENCOUNTER — Other Ambulatory Visit: Payer: Self-pay | Admitting: Sports Medicine

## 2019-08-07 DIAGNOSIS — R609 Edema, unspecified: Secondary | ICD-10-CM

## 2019-08-07 DIAGNOSIS — M25572 Pain in left ankle and joints of left foot: Secondary | ICD-10-CM

## 2019-08-07 DIAGNOSIS — M79671 Pain in right foot: Secondary | ICD-10-CM

## 2019-08-07 DIAGNOSIS — S99912A Unspecified injury of left ankle, initial encounter: Secondary | ICD-10-CM | POA: Diagnosis not present

## 2019-08-07 DIAGNOSIS — S99922A Unspecified injury of left foot, initial encounter: Secondary | ICD-10-CM

## 2019-08-07 DIAGNOSIS — M779 Enthesopathy, unspecified: Secondary | ICD-10-CM

## 2019-08-07 DIAGNOSIS — M25571 Pain in right ankle and joints of right foot: Secondary | ICD-10-CM

## 2019-08-07 DIAGNOSIS — S93402A Sprain of unspecified ligament of left ankle, initial encounter: Secondary | ICD-10-CM

## 2019-08-07 MED ORDER — PREDNISONE 10 MG (21) PO TBPK: ORAL_TABLET | ORAL | 0 refills | Status: DC

## 2019-08-07 MED ORDER — TRAMADOL HCL 50 MG PO TABS: 50.0000 mg | ORAL_TABLET | Freq: Three times a day (TID) | ORAL | 0 refills | Status: AC | PRN

## 2019-08-07 NOTE — Progress Notes (Addendum)
Subjective:  Alexander Weiss is a 54 y.o. male patient who presents to office for evaluation of left ankle reports several hours ago around 8 AM was out in the yard digging a ditch and stepped in a hole and thinks that he sprained his ankle reports pain and swelling that started about an hour later and since he has been icing and he took some ibuprofen but the pain is worse with walking causing him to limp pain is 8-9 out of 10 patient denies any significant redness or warmth nausea vomiting fever or chills.  Patient Active Problem List   Diagnosis Date Noted  . Acute pain of left knee 05/07/2018  . Enteritis, enteropathogenic E. coli 03/29/2017  . Postoperative examination 03/01/2016  . Non-recurrent bilateral inguinal hernia without obstruction or gangrene 01/26/2016  . Tobacco abuse 01/26/2016  . Acute mesenteric ischemia (HCC) 09/24/2015    Current Outpatient Medications on File Prior to Visit  Medication Sig Dispense Refill  . aspirin-acetaminophen-caffeine (EXCEDRIN MIGRAINE) 250-250-65 MG tablet Take by mouth every 6 (six) hours as needed for headache.    . diclofenac (VOLTAREN) 75 MG EC tablet Take 1 tablet (75 mg total) by mouth 2 (two) times daily. Take after you have finished medrol dosepak 30 tablet 0  . nebivolol (BYSTOLIC) 5 MG tablet Take 5 mg by mouth daily.    . pantoprazole (PROTONIX) 40 MG tablet TAKE 1 TABLET BY MOUTH 1/2 HOUR BEFORE BREAKFAST. 30 tablet 6   No current facility-administered medications on file prior to visit.    No Known Allergies  Objective:  General: Alert and oriented x3 in no acute distress  Dermatology: No open lesions bilateral lower extremities, no webspace macerations, no ecchymosis bilateral, all nails x 10 are well manicured.  Vascular: Dorsalis Pedis and Posterior Tibial pedal pulses palpable faintly, Capillary Fill Time 3 seconds, scant pedal hair growth bilateral, temperature gradient within normal limits bilateral.  Neurology: Gross  sensation intact via light touch bilateral.   Musculoskeletal: Moderate tenderness to palpation at dorsal and lateral ankle with significant soft tissue swelling over the sinus tarsi.  Range of motion is guarded due to pain.  Still admits symptoms on the right plantar arch at area of plantar fascia.  Strength within normal limits in all groups bilateral except on left due to pain.   Gait: Antalgic gait  Xrays  Left ankle   Impression: No acute osseous findings  Assessment and Plan: Problem List Items Addressed This Visit    None    Visit Diagnoses    Injury of left ankle and foot, initial encounter    -  Primary   Relevant Medications   predniSONE (STERAPRED UNI-PAK 21 TAB) 10 MG (21) TBPK tablet   traMADol (ULTRAM) 50 MG tablet   Acute left ankle pain       Swelling       Tendonitis       Sprain of left ankle, unspecified ligament, initial encounter           -Complete examination performed -Xrays reviewed -Discussed treatment options for likely sprain and acute pain of the ankle versus acute severe tendinitis secondary to injury -Rx Tramadol and prednisone Dosepak to take as instructed -[employer] boot and advised patient to keep it intact for 4 days on Sunday may remove if feeling better and may attempt to go to work on Monday with Tri-Lock ankle brace however at this time advised patient to be out of work due to acute trauma to left  foot and ankle -Dispensed cam boot for patient to wear at all times over the next several days and if feeling better may transition to tennis shoes with Tri-Lock brace -Advised patient to continue with rest ice elevation -Advised patient if pain fails to continue to get better may benefit from a MRI to rule out tear however at this time will treat like acute sprain -Patient to return to office if fails to continue to improve or sooner if condition worsens.  Landis Martins, DPM

## 2019-08-11 ENCOUNTER — Ambulatory Visit: Payer: BC Managed Care – PPO | Admitting: Podiatry

## 2019-08-11 ENCOUNTER — Other Ambulatory Visit: Payer: Self-pay

## 2019-08-11 ENCOUNTER — Encounter: Payer: Self-pay | Admitting: Podiatry

## 2019-08-11 DIAGNOSIS — S99912D Unspecified injury of left ankle, subsequent encounter: Secondary | ICD-10-CM | POA: Diagnosis not present

## 2019-08-11 DIAGNOSIS — M25572 Pain in left ankle and joints of left foot: Secondary | ICD-10-CM

## 2019-08-11 DIAGNOSIS — S99922D Unspecified injury of left foot, subsequent encounter: Secondary | ICD-10-CM

## 2019-08-11 DIAGNOSIS — R609 Edema, unspecified: Secondary | ICD-10-CM

## 2019-08-11 DIAGNOSIS — S93402D Sprain of unspecified ligament of left ankle, subsequent encounter: Secondary | ICD-10-CM | POA: Diagnosis not present

## 2019-08-11 NOTE — Progress Notes (Signed)
  Subjective:  Patient ID: [place], male    DOB: 12/10/1965,  MRN: [id number]  No chief complaint on file.   54 y.o. male presents for left ankle sprain f/u. Pain is doing better but still can't walk much on it. Feels a little better  Objective:  Physical Exam: warm, good capillary refill, no trophic changes or ulcerative lesions, normal DP and PT pulses and normal sensory exam. Left Foot: Pain at ATFL, CFL, some ATFL laxity on anterior drawer. Residual contusion. Assessment:   1. Injury of left ankle and foot, subsequent encounter   2. Acute left ankle pain   3. Swelling   4. Sprain of left ankle, unspecified ligament, subsequent encounter      Plan:  Patient was evaluated and treated and all questions answered.  Left ankle sprain -Will hold out of work for 1 more week at minimum given high demands at work on the ankle. -Wear ankle brace as ligament heals. -F/u in 2 weeks for recheck.  No follow-ups on file.

## 2019-08-12 MED ORDER — PREDNISONE 10 MG (21) PO TBPK: ORAL_TABLET | ORAL | 0 refills | Status: DC

## 2019-08-12 NOTE — Addendum Note (Signed)
Addended by: Lottie Rater E on: 08/12/2019 02:02 PM   Modules accepted: Orders

## 2019-08-12 NOTE — Addendum Note (Signed)
Addended by: Alphia Kava D on: 08/12/2019 10:56 AM   Modules accepted: Orders

## 2019-08-21 ENCOUNTER — Other Ambulatory Visit: Payer: Self-pay

## 2019-08-21 ENCOUNTER — Encounter: Payer: Self-pay | Admitting: Sports Medicine

## 2019-08-21 ENCOUNTER — Ambulatory Visit (INDEPENDENT_AMBULATORY_CARE_PROVIDER_SITE_OTHER): Payer: BC Managed Care – PPO | Admitting: Sports Medicine

## 2019-08-21 DIAGNOSIS — M779 Enthesopathy, unspecified: Secondary | ICD-10-CM

## 2019-08-21 DIAGNOSIS — M25572 Pain in left ankle and joints of left foot: Secondary | ICD-10-CM | POA: Diagnosis not present

## 2019-08-21 NOTE — Progress Notes (Signed)
Subjective:  [place] is a 54 y.o. male patient who presents to office for follow-up evaluation of left ankle reports that pain is better than last visit 4 out of 10 but still constant over the side of the ankle reports that the swelling has gone down and has not been able to wear a brace because he left at work has been out of work since his ankle injury.  Patient denies any other pedal complaints at this time.  Patient Active Problem List   Diagnosis Date Noted  . Acute pain of left knee 05/07/2018  . Enteritis, enteropathogenic E. coli 03/29/2017  . Postoperative examination 03/01/2016  . Non-recurrent bilateral inguinal hernia without obstruction or gangrene 01/26/2016  . Tobacco abuse 01/26/2016  . Acute mesenteric ischemia (HCC) 09/24/2015    Current Outpatient Medications on File Prior to Visit  Medication Sig Dispense Refill  . aspirin-acetaminophen-caffeine (EXCEDRIN MIGRAINE) 250-250-65 MG tablet Take by mouth every 6 (six) hours as needed for headache.    . diclofenac (VOLTAREN) 75 MG EC tablet Take 1 tablet (75 mg total) by mouth 2 (two) times daily. Take after you have finished medrol dosepak 30 tablet 0  . nebivolol (BYSTOLIC) 5 MG tablet Take 5 mg by mouth daily.    . pantoprazole (PROTONIX) 40 MG tablet TAKE 1 TABLET BY MOUTH 1/2 HOUR BEFORE BREAKFAST. 30 tablet 6  . predniSONE (STERAPRED UNI-PAK 21 TAB) 10 MG (21) TBPK tablet Take as directed 21 tablet 0   No current facility-administered medications on file prior to visit.    No Known Allergies  Objective:  General: Alert and oriented x3 in no acute distress  Dermatology: No open lesions bilateral lower extremities, no webspace macerations, no ecchymosis bilateral, all nails x 10 are well manicured.  Vascular: Dorsalis Pedis and Posterior Tibial pedal pulses palpable faintly, Capillary Fill Time 3 seconds, scant pedal hair growth bilateral, temperature gradient within normal limits bilateral.  Neurology:  Gross sensation intact via light touch bilateral.   Musculoskeletal: Mild tenderness to palpation at dorsal and lateral ankle with most pain at lateral ankle and sinus tarsi.  Anterior drawer sign positive.  Strength within normal limits in all groups bilateral except on left due to pain at lateral ankle.   Assessment and Plan: Problem List Items Addressed This Visit    None    Visit Diagnoses    Arthralgia of left foot    -  Primary   Relevant Orders   Basic Metabolic Panel   CBC with Differential/Platelet   C-reactive protein   Sedimentation rate   Capsulitis       Relevant Orders   Basic Metabolic Panel   CBC with Differential/Platelet   C-reactive protein   Sedimentation rate       -Complete examination performed -Discussed continued care for severe ankle sprain  -Ordered blood work and advised patient if inflammation markers are elevated would encourage him to take prednisone by mouth that I prescribed previously to help with his pain -Advised patient to continue with tramadol however he has not taken any encouraged him to take it if he needs for severe pain -Advised patient to continue with rest ice elevation -Advised patient to continue with using Tri-Lock brace in office we applied a brace for patient today since he left his previous one at work -Continue with no work until next office visit due to ankle pain -Patient to return to office after blood work. Asencion Islam, DPM

## 2019-08-22 ENCOUNTER — Telehealth: Payer: Self-pay

## 2019-08-22 LAB — C-REACTIVE PROTEIN: CRP: 1 mg/L (ref 0–10)

## 2019-08-22 LAB — CBC WITH DIFFERENTIAL/PLATELET
Basophils Absolute: 0.1 10*3/uL (ref 0.0–0.2)
Basos: 1 %
EOS (ABSOLUTE): 0.2 10*3/uL (ref 0.0–0.4)
Eos: 3 %
Hematocrit: 45.1 % (ref 37.5–51.0)
Hemoglobin: 16.1 g/dL (ref 13.0–17.7)
Immature Grans (Abs): 0 10*3/uL (ref 0.0–0.1)
Immature Granulocytes: 0 %
Lymphocytes Absolute: 2.2 10*3/uL (ref 0.7–3.1)
Lymphs: 34 %
MCH: 33 pg (ref 26.6–33.0)
MCHC: 35.7 g/dL (ref 31.5–35.7)
MCV: 92 fL (ref 79–97)
Monocytes Absolute: 0.5 10*3/uL (ref 0.1–0.9)
Monocytes: 8 %
Neutrophils Absolute: 3.5 10*3/uL (ref 1.4–7.0)
Neutrophils: 54 %
Platelets: 317 10*3/uL (ref 150–450)
RBC: 4.88 x10E6/uL (ref 4.14–5.80)
RDW: 12.3 % (ref 11.6–15.4)
WBC: 6.5 10*3/uL (ref 3.4–10.8)

## 2019-08-22 LAB — BASIC METABOLIC PANEL
BUN/Creatinine Ratio: 6 — ABNORMAL LOW (ref 9–20)
BUN: 5 mg/dL — ABNORMAL LOW (ref 6–24)
CO2: 23 mmol/L (ref 20–29)
Calcium: 9.5 mg/dL (ref 8.7–10.2)
Chloride: 99 mmol/L (ref 96–106)
Creatinine, Ser: 0.82 mg/dL (ref 0.76–1.27)
GFR calc Af Amer: 118 mL/min/{1.73_m2} (ref >59)
GFR calc non Af Amer: 102 mL/min/{1.73_m2} (ref >59)
Glucose: 90 mg/dL (ref 65–99)
Potassium: 4.3 mmol/L (ref 3.5–5.2)
Sodium: 136 mmol/L (ref 134–144)

## 2019-08-22 LAB — SEDIMENTATION RATE: Sed Rate: 37 mm/hr — ABNORMAL HIGH (ref 0–30)

## 2019-08-22 NOTE — Telephone Encounter (Signed)
-----   Message from Titorya Stover, DPM sent at 08/22/2019  7:12 AM EDT ----- Will you let the patient know that his inflammation markers are elevated and to start the prednisone medication that he should already have at home.  Also let him know that there was a small decrease in his kidney function and it is important for him to stay hydrated and to also follow-up with his PCP for any further recommendations. 

## 2019-08-22 NOTE — Telephone Encounter (Signed)
LVm to pt stating to return our call to review lab work

## 2019-08-27 ENCOUNTER — Telehealth: Payer: Self-pay

## 2019-08-27 NOTE — Telephone Encounter (Signed)
-----   Message from Asencion Islam, [place] sent at 08/22/2019  7:12 AM EDT ----- Will you let the patient know that his inflammation markers are elevated and to start the prednisone medication that he should already have at home.  Also let him know that there was a small decrease in his kidney function and it is important for him to stay hydrated and to also follow-up with his PCP for any further recommendations.

## 2019-08-27 NOTE — Telephone Encounter (Signed)
LVM to pt stating to return our phone call to review Lab results and Dr's recommendations

## 2019-08-28 ENCOUNTER — Encounter: Payer: Self-pay | Admitting: Sports Medicine

## 2019-08-28 ENCOUNTER — Other Ambulatory Visit: Payer: Self-pay

## 2019-08-28 ENCOUNTER — Ambulatory Visit: Payer: BC Managed Care – PPO | Admitting: Sports Medicine

## 2019-08-28 DIAGNOSIS — S93402D Sprain of unspecified ligament of left ankle, subsequent encounter: Secondary | ICD-10-CM | POA: Diagnosis not present

## 2019-08-28 DIAGNOSIS — M25572 Pain in left ankle and joints of left foot: Secondary | ICD-10-CM | POA: Diagnosis not present

## 2019-08-28 DIAGNOSIS — R609 Edema, unspecified: Secondary | ICD-10-CM

## 2019-08-28 DIAGNOSIS — M779 Enthesopathy, unspecified: Secondary | ICD-10-CM

## 2019-08-28 DIAGNOSIS — S99912D Unspecified injury of left ankle, subsequent encounter: Secondary | ICD-10-CM | POA: Diagnosis not present

## 2019-08-28 DIAGNOSIS — S99922D Unspecified injury of left foot, subsequent encounter: Secondary | ICD-10-CM

## 2019-08-28 NOTE — Progress Notes (Signed)
Subjective:  [place] Weiss is a 54 y.o. male patient who presents to office for follow-up evaluation of left ankle reports that pain is doing pretty good list 1-2 out of 10 but there is still a little pain with direct touch to the lateral ankle on the left has been wearing his brace and has been taking his prednisone and is here to review his blood work.  Patient also admits that occasionally he does have pain still on the right where he had previous history of a possible short segment longitudinal split tear of the peroneus brevis.  Patient denies any other pedal complaints at this time.  Patient Active Problem List   Diagnosis Date Noted  . Acute pain of left knee 05/07/2018  . Enteritis, enteropathogenic E. coli 03/29/2017  . Postoperative examination 03/01/2016  . Non-recurrent bilateral inguinal hernia without obstruction or gangrene 01/26/2016  . Tobacco abuse 01/26/2016  . Acute mesenteric ischemia ([place]) 09/24/2015    Current Outpatient Medications on File Prior to Visit  Medication Sig Dispense Refill  . aspirin-acetaminophen-caffeine (EXCEDRIN MIGRAINE) 250-250-65 MG tablet Take by mouth every 6 (six) hours as needed for headache.    . diclofenac (VOLTAREN) 75 MG EC tablet Take 1 tablet (75 mg total) by mouth 2 (two) times daily. Take after you have finished medrol dosepak 30 tablet 0  . nebivolol (BYSTOLIC) 5 MG tablet Take 5 mg by mouth daily.    . pantoprazole (PROTONIX) 40 MG tablet TAKE 1 TABLET BY MOUTH 1/2 HOUR BEFORE BREAKFAST. 30 tablet 6  . predniSONE (STERAPRED UNI-PAK 21 TAB) 10 MG (21) TBPK tablet Take as directed 21 tablet 0   No current facility-administered medications on file prior to visit.    No Known Allergies  Objective:  General: Alert and oriented x3 in no acute distress  Dermatology: No open lesions bilateral lower extremities, no webspace macerations, no ecchymosis bilateral, all nails x 10 are well manicured.  Vascular: Dorsalis Pedis and  Posterior Tibial pedal pulses palpable faintly, Capillary Fill Time 3 seconds, scant pedal hair growth bilateral, temperature gradient within normal limits bilateral.  Neurology: Gross sensation intact via light touch bilateral.   Musculoskeletal: Mild tenderness to palpation at dorsal and lateral ankle with most pain at lateral ankle and sinus tarsi again on today's visit however appears to be slowly improving.  Anterior drawer sign positive.  Strength within normal limits in all groups bilateral except on left due to pain at lateral ankle and also pain at right lateral ankle.   Assessment and Plan: Problem List Items Addressed This Visit    None    Visit Diagnoses    Capsulitis    -  Primary   Injury of left ankle and foot, subsequent encounter       Arthralgia of left foot       Sprain of left ankle, unspecified ligament, subsequent encounter       Swelling       Acute left ankle pain       Tendonitis           -Complete examination performed -Discussed continued care for severe ankle sprain on the left and continued tingling pain on the right -Previous blood work reviewed with patient with inflammatory markers mildly elevated -Patient completed prednisone this morning -Advised patient to continue with rest ice elevation -Advised patient to continue with using Tri-Lock brace -Prescribed physical therapy for left ankle sprain and right chronic tendinitis -Continue with no work until next office visit -Patient  to return to office in 3 weeks or sooner if problems or issues arise.  Advised patient that once he finishes physical therapy may benefit from orthotics. Asencion Islam, DPM

## 2019-08-29 ENCOUNTER — Encounter: Payer: Self-pay | Admitting: Sports Medicine

## 2019-09-18 ENCOUNTER — Other Ambulatory Visit: Payer: Self-pay

## 2019-09-18 ENCOUNTER — Encounter: Payer: Self-pay | Admitting: Sports Medicine

## 2019-09-18 ENCOUNTER — Ambulatory Visit: Payer: BC Managed Care – PPO | Admitting: Sports Medicine

## 2019-09-18 DIAGNOSIS — S93402D Sprain of unspecified ligament of left ankle, subsequent encounter: Secondary | ICD-10-CM | POA: Diagnosis not present

## 2019-09-18 DIAGNOSIS — M25572 Pain in left ankle and joints of left foot: Secondary | ICD-10-CM | POA: Diagnosis not present

## 2019-09-18 DIAGNOSIS — S99912D Unspecified injury of left ankle, subsequent encounter: Secondary | ICD-10-CM | POA: Diagnosis not present

## 2019-09-18 DIAGNOSIS — S99922D Unspecified injury of left foot, subsequent encounter: Secondary | ICD-10-CM

## 2019-09-18 DIAGNOSIS — M779 Enthesopathy, unspecified: Secondary | ICD-10-CM

## 2019-09-18 DIAGNOSIS — R609 Edema, unspecified: Secondary | ICD-10-CM

## 2019-09-18 NOTE — Progress Notes (Signed)
Subjective: [place] Weiss is a 54 y.o. male patient who returns to office for follow-up evaluation of left ankle/foot pain. Reports that pain is better, now 2/10 and reports that PT is helping. Patient reports that with shoes rubbing at back of heel and both feet hurt.  Patient denies any other pedal complaints at this time.  Patient Active Problem List   Diagnosis Date Noted  . Acute pain of left knee 05/07/2018  . Enteritis, enteropathogenic E. coli 03/29/2017  . Postoperative examination 03/01/2016  . Non-recurrent bilateral inguinal hernia without obstruction or gangrene 01/26/2016  . Tobacco abuse 01/26/2016  . Acute mesenteric ischemia (HCC) 09/24/2015    Current Outpatient Medications on File Prior to Visit  Medication Sig Dispense Refill  . aspirin-acetaminophen-caffeine (EXCEDRIN MIGRAINE) 250-250-65 MG tablet Take by mouth every 6 (six) hours as needed for headache.    . diclofenac (VOLTAREN) 75 MG EC tablet Take 1 tablet (75 mg total) by mouth 2 (two) times daily. Take after you have finished medrol dosepak 30 tablet 0  . nebivolol (BYSTOLIC) 5 MG tablet Take 5 mg by mouth daily.    . pantoprazole (PROTONIX) 40 MG tablet TAKE 1 TABLET BY MOUTH 1/2 HOUR BEFORE BREAKFAST. 30 tablet 6   No current facility-administered medications on file prior to visit.    No Known Allergies  Objective:  General: Alert and oriented x3 in no acute distress  Dermatology: No open lesions bilateral lower extremities, no webspace macerations, no ecchymosis bilateral, all nails x 10 are well manicured.  Vascular: Dorsalis Pedis and Posterior Tibial pedal pulses palpable faintly, Capillary Fill Time 3 seconds, scant pedal hair growth bilateral, temperature gradient within normal limits bilateral.  Neurology: Gross sensation intact via light touch bilateral.   Musculoskeletal: Minimal tenderness to palpation at dorsal and lateral ankle on left. Clicking on right ankle at peroneal tendon course.  Strength within normal limits in all groups bilateral except on left resolving pain at lateral ankle and also pain at right lateral ankle.   Assessment and Plan: Problem List Items Addressed This Visit    None    Visit Diagnoses    Capsulitis    -  Primary   Tendonitis       Injury of left ankle and foot, subsequent encounter       Arthralgia of left foot       Sprain of left ankle, unspecified ligament, subsequent encounter       Swelling          -Complete examination performed -Re-Discussed continued care for ankle sprain on the left and continued pain on the right -Continue with PT until completed -Dispensed powersteps and patient to see Betha for custom orthotics -Return to work 6/21  -Patient to return to office as scheduled for orthotics.   Asencion Islam, DPM

## 2019-09-26 ENCOUNTER — Other Ambulatory Visit: Payer: BC Managed Care – PPO

## 2019-10-01 ENCOUNTER — Telehealth: Payer: Self-pay

## 2019-10-01 NOTE — Telephone Encounter (Signed)
Yes patient can be discharged. Morrie Sheldon will fax the order. -Dr. Kathie Rhodes

## 2019-10-01 NOTE — Telephone Encounter (Signed)
Joni Reining from PT called and LVM on the nurse line asking if pt has been discharged from PT. If so we can fax order to [phone]

## 2019-10-07 ENCOUNTER — Other Ambulatory Visit: Payer: Self-pay

## 2019-10-07 ENCOUNTER — Other Ambulatory Visit: Payer: BC Managed Care – PPO | Admitting: Orthotics

## 2023-10-10 ENCOUNTER — Ambulatory Visit: Admitting: Podiatry

## 2023-10-10 DIAGNOSIS — M79674 Pain in right toe(s): Secondary | ICD-10-CM | POA: Diagnosis not present

## 2023-10-10 DIAGNOSIS — M79675 Pain in left toe(s): Secondary | ICD-10-CM

## 2023-10-10 NOTE — Progress Notes (Signed)
     Chief Complaint  Patient presents with   Nail Problem    Bilateral first nails, not sure if they are true ingrowns. He states that sometimes they hurt on the borders but sometimes on the top of the nails. They do not look in-grown. Not diabetic and no anti coag. No meds, pharmacy updated.    HPI: 58 y.o. male presents today with concern of bilateral hallux toenail discomfort.  He was wondering if his toenails are ingrown.  He does not feel that they are fungal.  He has a newer pair of steel toe boots that he is wearing at work, and does a lot of walking at work.  He is wondering if they might be too small.  He states that he used to work on slippery surfaces and has a tendency to grip his toes when walking.  He no longer works on slippery surfaces but states he still feels like he occasionally continues to grip with his toes when walking.  History reviewed. No pertinent past medical history. History reviewed. No pertinent surgical history. No Known Allergies   Physical Exam: Palpable pedal pulses noted.  There is no incurvation of the medial or lateral hallux nail margins bilateral.  No surrounding erythema or edema is noted along the nail margins.  There is pain with central/dorsal and distal/dorsal compression of the nail/toe bilateral hallux.  No dusky discoloration is noted to the toenails.  Assessment/Plan of Care: 1. Pain in toes of both feet     Patient informed that he did not have any signs of onychocryptosis or paronychia today.  No procedures need to be performed.  He was instructed to remove the insoles of his work boots and look where his toe impressions are.  If they are at the tip of the insole, that means his boots are too small and needs to at least size up a half size.  He was also fitted and dispensed large silicone toe caps for protection of the toes while wearing his steel toe boots.  Follow-up as needed   Awanda CHARM Imperial, DPM, FACFAS [facility]      [address] [place], [place] [address]                Office [phone]  Fax [phone]

## 2023-10-14 ENCOUNTER — Encounter: Payer: Self-pay | Admitting: Podiatry

## 2024-02-07 ENCOUNTER — Ambulatory Visit (HOSPITAL_BASED_OUTPATIENT_CLINIC_OR_DEPARTMENT_OTHER)
Admission: EM | Admit: 2024-02-07 | Discharge: 2024-02-07 | Disposition: A | Discharge status: Home / Self Care | Attending: Family Medicine | Admitting: Family Medicine

## 2024-02-07 ENCOUNTER — Encounter (HOSPITAL_BASED_OUTPATIENT_CLINIC_OR_DEPARTMENT_OTHER): Payer: Self-pay | Admitting: Emergency Medicine

## 2024-02-07 ENCOUNTER — Other Ambulatory Visit (HOSPITAL_BASED_OUTPATIENT_CLINIC_OR_DEPARTMENT_OTHER): Payer: Self-pay

## 2024-02-07 DIAGNOSIS — H0100A Unspecified blepharitis right eye, upper and lower eyelids: Secondary | ICD-10-CM | POA: Diagnosis not present

## 2024-02-07 DIAGNOSIS — H02822 Cysts of right lower eyelid: Secondary | ICD-10-CM

## 2024-02-07 MED ORDER — MOXIFLOXACIN HCL 0.5 % OP SOLN
1.0000 [drp] | Freq: Three times a day (TID) | OPHTHALMIC | 0 refills | Status: AC
  Filled 2024-02-07: qty 3, 10d supply, fill #0

## 2024-02-07 NOTE — Discharge Instructions (Addendum)
 Blepharitis and sebaceous cyst to right eyelid: Moxifloxacin ophthalmic drops, 1 drop, 3 times daily to right eye for 5 to 7 days.  Educated about the sebaceous cyst.  Needs to see ophthalmology or optometry.  Provided information about [facility] in [place].  Follow-up here as needed.

## 2024-02-07 NOTE — ED Provider Notes (Signed)
 [facility]    CSN: [id number] Arrival date & time: 02/07/24  1307      History   Chief Complaint Chief Complaint  Patient presents with   Eye Problem    HPI [place] Weiss is a 58 y.o. male.   58 year old male who reports he has had right eye irritation and a blister on the right lower eyelid for over a month since prior to 01/02/2024.  He is going through a separation and divorce and just has not had time to get it taken care of.  Now the eye irritation is beginning to hurt and the eye is getting red.  He tried to get in with several different optometry and ophthalmology groups and it is many months wait.  The eye is watery daily now.   Eye Problem Associated symptoms: discharge and redness   Associated symptoms: no nausea and no vomiting     History reviewed. No pertinent past medical history.  Patient Active Problem List   Diagnosis Date Noted   Acute pain of left knee 05/07/2018   Enteritis, enteropathogenic E. coli 03/29/2017   Postoperative examination 03/01/2016   Non-recurrent bilateral inguinal hernia without obstruction or gangrene 01/26/2016   Tobacco abuse 01/26/2016   Acute mesenteric ischemia 09/24/2015    History reviewed. No pertinent surgical history.     Home Medications    Prior to Admission medications   Medication Sig Start Date End Date Taking? Authorizing Provider  moxifloxacin (VIGAMOX) 0.5 % ophthalmic solution Place 1 drop into the right eye 3 (three) times daily. 02/07/24  Yes Ival Domino, FNP  aspirin-acetaminophen-caffeine (EXCEDRIN MIGRAINE) 250-250-65 MG tablet Take by mouth every 6 (six) hours as needed for headache.    [provider]  diclofenac  (VOLTAREN ) 75 MG EC tablet Take 1 tablet (75 mg total) by mouth 2 (two) times daily. Take after you have finished medrol  dosepak 05/22/18   Stover, Titorya, DPM  nebivolol (BYSTOLIC) 5 MG tablet Take 5 mg by mouth daily.    [provider]  pantoprazole   (PROTONIX ) 40 MG tablet TAKE 1 TABLET BY MOUTH 1/2 HOUR BEFORE BREAKFAST. 07/18/18   Charlanne Groom, MD    Family History Family History  Problem Relation Age of Onset   Arrhythmia Mother    Heart attack Father     Social History Social History   Tobacco Use   Smoking status: Every Day    Current packs/day: 1.00    Types: Cigarettes   Smokeless tobacco: Former  Substance Use Topics   Alcohol use: Never   Drug use: Never     Allergies   Patient has no known allergies.   Review of Systems Review of Systems  Constitutional:  Negative for chills and fever.  HENT:  Negative for ear pain and sore throat.   Eyes:  Positive for pain, discharge and redness. Negative for visual disturbance.  Respiratory:  Negative for cough.   Cardiovascular:  Negative for chest pain and palpitations.  Gastrointestinal:  Negative for abdominal pain, constipation, diarrhea, nausea and vomiting.  Genitourinary:  Negative for dysuria and hematuria.  Musculoskeletal:  Negative for arthralgias and back pain.  Skin:  Negative for color change and rash.  Neurological:  Negative for seizures and syncope.  All other systems reviewed and are negative.    Physical Exam Triage Vital Signs ED Triage Vitals  Encounter Vitals Group     BP 02/07/24 1320 126/84     Girls Systolic BP Percentile --  Girls Diastolic BP Percentile --      Boys Systolic BP Percentile --      Boys Diastolic BP Percentile --      Pulse Rate 02/07/24 1320 91     Resp 02/07/24 1320 18     Temp 02/07/24 1320 97.7 F (36.5 C)     Temp Source 02/07/24 1320 Oral     SpO2 02/07/24 1320 95 %     Weight --      Height --      Head Circumference --      Peak Flow --      Pain Score 02/07/24 1319 6     Pain Loc --      Pain Education --      Exclude from Growth Chart --    No data found.  Updated Vital Signs BP 126/84 (BP Location: Right Arm)   Pulse 91   Temp 97.7 F (36.5 C) (Oral)   Resp 18   SpO2 95%   Visual  Acuity Right Eye Distance:   Left Eye Distance:   Bilateral Distance:    Right Eye Near:   Left Eye Near:    Bilateral Near:     Physical Exam Vitals and nursing note reviewed.  Constitutional:      General: He is not in acute distress.    Appearance: He is well-developed. He is not ill-appearing or toxic-appearing.  HENT:     Head: Normocephalic and atraumatic.     Right Ear: Hearing, tympanic membrane, ear canal and external ear normal.     Left Ear: Hearing, tympanic membrane, ear canal and external ear normal.     Nose: No congestion or rhinorrhea.     Right Sinus: No maxillary sinus tenderness or frontal sinus tenderness.     Left Sinus: No maxillary sinus tenderness or frontal sinus tenderness.     Mouth/Throat:     Lips: Pink.     Mouth: Mucous membranes are moist.     Pharynx: Uvula midline. No oropharyngeal exudate or posterior oropharyngeal erythema.     Tonsils: No tonsillar exudate.  Eyes:     General:        Right eye: Discharge (Watery discharge) present. No foreign body or hordeolum.        Left eye: No foreign body, discharge or hordeolum.     Conjunctiva/sclera: Conjunctivae normal.     Right eye: Right conjunctiva is not injected. Exudate (Watery discharge) present. No chemosis or hemorrhage.    Left eye: Left conjunctiva is not injected. No chemosis, exudate or hemorrhage.    Pupils: Pupils are equal, round, and reactive to light.   Cardiovascular:     Rate and Rhythm: Normal rate and regular rhythm.     Heart sounds: S1 normal and S2 normal. No murmur heard. Pulmonary:     Effort: Pulmonary effort is normal. No respiratory distress.     Breath sounds: Normal breath sounds. No decreased breath sounds, wheezing, rhonchi or rales.  Abdominal:     General: Bowel sounds are normal.     Palpations: Abdomen is soft.     Tenderness: There is no abdominal tenderness.  Musculoskeletal:        General: No swelling.     Cervical back: Neck supple.   Lymphadenopathy:     Head:     Right side of head: No submental, submandibular, tonsillar, preauricular or posterior auricular adenopathy.     Left side of head: No submental, submandibular, tonsillar,  preauricular or posterior auricular adenopathy.     Cervical: No cervical adenopathy.     Right cervical: No superficial cervical adenopathy.    Left cervical: No superficial cervical adenopathy.  Skin:    General: Skin is warm and dry.     Capillary Refill: Capillary refill takes less than 2 seconds.     Findings: No rash.  Neurological:     Mental Status: He is alert and oriented to person, place, and time.  Psychiatric:        Mood and Affect: Mood normal.      UC Treatments / Results  Labs (all labs ordered are listed, but only abnormal results are displayed) Labs Reviewed - No data to display  EKG   Radiology No results found.  Procedures Procedures (including critical care time)  Medications Ordered in UC Medications - No data to display  Initial Impression / Assessment and Plan / UC Course  I have reviewed the triage vital signs and the nursing notes.  Pertinent labs & imaging results that were available during my care of the patient were reviewed by me and considered in my medical decision making (see chart for details).  Plan of Care: Blepharitis and sebaceous cyst to right eyelid: Moxifloxacin ophthalmic drops, 1 drop, 3 times daily to right eye for 5 to 7 days.  Educated about the sebaceous cyst.  Advised not to try to pinch it like a pimple or pop it with a needle.  Encouraged to leave it alone.  Needs to see ophthalmology or optometry.  Provided information about [facility] in [place].  Follow-up here as needed.  I reviewed the plan of care with the patient and/or the patient's guardian.  The patient and/or guardian had time to ask questions and acknowledged that the questions were answered.  I provided instruction on symptoms or reasons to  return here or to go to an ER, if symptoms/condition did not improve, worsened or if new symptoms occurred.  Final Clinical Impressions(s) / UC Diagnoses   Final diagnoses:  Sebaceous cyst of right lower eyelid  Blepharitis of both upper and lower eyelid of right eye, unspecified type     Discharge Instructions      Blepharitis and sebaceous cyst to right eyelid: Moxifloxacin ophthalmic drops, 1 drop, 3 times daily to right eye for 5 to 7 days.  Educated about the sebaceous cyst.  Needs to see ophthalmology or optometry.  Provided information about [facility] in [place].  Follow-up here as needed.     ED Prescriptions     Medication Sig Dispense Auth. Provider   moxifloxacin (VIGAMOX) 0.5 % ophthalmic solution Place 1 drop into the right eye 3 (three) times daily. 3 mL Ival Domino, FNP      PDMP not reviewed this encounter.   Ival Domino, FNP 02/07/24 1358

## 2024-02-07 NOTE — ED Triage Notes (Signed)
 Pt reports he has a clear blister area to his right eye. Pt denies any vision issues he reports the eye is watery.
